# Patient Record
Sex: Male | Born: 1975 | Race: White | Hispanic: No | Marital: Single | Smoking: Former smoker
Health system: Southern US, Community
[De-identification: ages and names within clinical notes are randomized; demographics above are authoritative.]

## PROBLEM LIST (undated history)

## (undated) DIAGNOSIS — F32A Depression, unspecified: Secondary | ICD-10-CM

## (undated) DIAGNOSIS — I10 Essential (primary) hypertension: Secondary | ICD-10-CM

## (undated) DIAGNOSIS — T7840XA Allergy, unspecified, initial encounter: Secondary | ICD-10-CM

## (undated) DIAGNOSIS — J302 Other seasonal allergic rhinitis: Secondary | ICD-10-CM

## (undated) HISTORY — PX: KNEE SURGERY: SHX244

## (undated) HISTORY — DX: Allergy, unspecified, initial encounter: T78.40XA

---

## 2014-04-23 LAB — BASIC METABOLIC PANEL WITH GFR
BUN: 17 mg/dL (ref 4–21)
Creatinine: 1.1 mg/dL (ref <=1.3)
Glucose: 99 mg/dL
Potassium: 4.1 mmol/L (ref 3.4–5.3)
Sodium: 141 mmol/L (ref 137–147)

## 2014-04-23 LAB — HEPATIC FUNCTION PANEL
ALT: 52 U/L — AB (ref 10–40)
AST: 26 U/L (ref 14–40)
Alkaline Phosphatase: 52 U/L (ref 25–125)

## 2014-04-23 LAB — LIPID PANEL
Cholesterol: 219 mg/dL — AB (ref 0–200)
HDL: 38 mg/dL (ref 35–70)
LDL Cholesterol: 124 mg/dL
LDl/HDL Ratio: 3.3
Triglycerides: 283 mg/dL — AB (ref 40–160)

## 2014-04-23 LAB — CBC AND DIFFERENTIAL
HCT: 44 % (ref 41–53)
Hemoglobin: 14.9 g/dL (ref 13.5–17.5)
Neutrophils Absolute: 2 /uL
Platelets: 183 K/µL (ref 150–399)
WBC: 4 10*3/mL

## 2014-04-23 LAB — PSA: PSA: 0.5

## 2014-04-23 LAB — TSH: TSH: 1.31 u[IU]/mL (ref <=5.90)

## 2015-01-14 DIAGNOSIS — J309 Allergic rhinitis, unspecified: Secondary | ICD-10-CM | POA: Insufficient documentation

## 2015-01-14 DIAGNOSIS — F411 Generalized anxiety disorder: Secondary | ICD-10-CM | POA: Insufficient documentation

## 2015-01-14 DIAGNOSIS — G4700 Insomnia, unspecified: Secondary | ICD-10-CM | POA: Insufficient documentation

## 2015-01-14 DIAGNOSIS — D1720 Benign lipomatous neoplasm of skin and subcutaneous tissue of unspecified limb: Secondary | ICD-10-CM | POA: Insufficient documentation

## 2015-01-14 DIAGNOSIS — E669 Obesity, unspecified: Secondary | ICD-10-CM | POA: Insufficient documentation

## 2015-01-15 ENCOUNTER — Encounter: Payer: Self-pay | Admitting: Family Medicine

## 2015-01-15 ENCOUNTER — Ambulatory Visit (INDEPENDENT_AMBULATORY_CARE_PROVIDER_SITE_OTHER): Payer: BLUE CROSS/BLUE SHIELD | Admitting: Family Medicine

## 2015-01-15 VITALS — BP 110/68 | HR 88 | Temp 97.6°F | Resp 16 | Ht >= 80 in | Wt 347.0 lb

## 2015-01-15 DIAGNOSIS — J309 Allergic rhinitis, unspecified: Secondary | ICD-10-CM

## 2015-01-15 DIAGNOSIS — I889 Nonspecific lymphadenitis, unspecified: Secondary | ICD-10-CM

## 2015-01-15 MED ORDER — FLUTICASONE PROPIONATE 50 MCG/ACT NA SUSP: 2.0000 | Freq: Every day | NASAL | Status: DC

## 2015-01-15 NOTE — Progress Notes (Signed)
Subjective:    Patient ID: Andrew Andersen, male    DOB: 1975/10/24, 39 y.o.   MRN: [id number]  HPI Allergic Rhinitis: Izic Stfort is here for evaluation of persistent allergic rhinitis. Patient's symptoms include nasal congestion, phlegm in throat . These symptoms are seasonal. Current triggers include exposure to dust, pollen. The patient has been suffering from these symptoms for approximately 3 months. The patient has tried prescription nasal sprays (Astelin) with minor relief of symptoms. Immunotherapy has never been tried. The patient has never had nasal polyps. The patient has no history of asthma. The patient does not suffer from frequent sinopulmonary infections. The patient has not had sinus surgery in the past. The patient has no history of eczema. Patient reports that he has seen Dr. Richardson Landry at [facility], last ov was about 6 months ago. Patient reports that he been tested once for allergies with scratch test and once with blood test. Patient reports that he has been taking OTC Allergy Relief -D for to help with symptoms. Patient reports that today he is feeling much better. Patient reports that he is not sure if quitting snuff may have improved his symptoms.   Review of Systems  Constitutional: Negative.   HENT: Positive for congestion, postnasal drip, rhinorrhea, sinus pressure, sore throat, tinnitus and trouble swallowing.   Eyes: Negative.   Respiratory: Negative.  Negative for cough, shortness of breath and wheezing.   Endocrine: Negative.   Genitourinary: Negative.   Musculoskeletal: Negative.   Allergic/Immunologic: Positive for environmental allergies.  Neurological: Negative.   Psychiatric/Behavioral: Negative.    Patient Active Problem List   Diagnosis Date Noted  . Allergic rhinitis 01/14/2015  . Anxiety, generalized 01/14/2015  . Cannot sleep 01/14/2015  . 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 Adiposity 01/14/2015   Past Medical History  Diagnosis Date  . Allergy     Current Outpatient Prescriptions on File Prior to Visit  Medication Sig  . azelastine (ASTELIN) 0.1 % nasal spray Place into the nose.   No current facility-administered medications on file prior to visit.   No Known Allergies Past Surgical History  Procedure Laterality Date  . Knee surgery Left    History   Social History  . Marital Status: Single    Spouse Name: N/A  . Number of Children: N/A  . Years of Education: N/A   Occupational History  . Not on file.   Social History Main Topics  . Smoking status: Former Smoker -- 1.00 packs/day for 10 years    Quit date: 07/24/2012  . Smokeless tobacco: Former Systems developer    Types: Snuff    Quit date: 01/13/2015  . Alcohol Use: No  . Drug Use: No  . Sexual Activity: Not on file   Other Topics Concern  . Not on file   Social History Narrative   Family History  Problem Relation Age of Onset  . Hypertension Mother   . Heart disease Maternal Grandmother   . Heart attack Maternal Grandfather   . Dementia Paternal Grandmother   . Alzheimer's disease Paternal Grandmother        Objective:   Physical Exam  Constitutional: He is oriented to person, place, and time. He appears well-developed and well-nourished.  HENT:  Head: Normocephalic and atraumatic.  Right Ear: External ear normal.  Left Ear: External ear normal.  Nose: Nose normal.  Mouth/Throat: Oropharynx is clear and moist.  Eyes: Conjunctivae and EOM are normal. Pupils are equal, round, and reactive to light.  Neck: Normal  range of motion. Neck supple.  Cardiovascular: Normal rate, regular rhythm, normal heart sounds and intact distal pulses.   Pulmonary/Chest: Effort normal and breath sounds normal.  Abdominal: Soft. Bowel sounds are normal.  Lymphadenopathy:       Head (right side): No submandibular and no tonsillar adenopathy present.       Head (left side): No submandibular and no tonsillar adenopathy present.       Right cervical: No superficial cervical,  no deep cervical and no posterior cervical adenopathy present.      Left cervical: No superficial cervical, no deep cervical and no posterior cervical adenopathy present.  Neurological: He is alert and oriented to person, place, and time.  Skin: Skin is warm and dry.  Psychiatric: He has a normal mood and affect. His behavior is normal. Judgment and thought content normal.   Blood pressure 110/68, pulse 88, temperature 97.6 F (36.4 C), temperature source Oral, resp. rate 16, height 6\' 8"  (2.032 m), weight 347 lb (157.398 kg), SpO2 99 %.      Assessment & Plan:   1. Allergic rhinitis, unspecified allergic rhinitis type Worsening. Patient advised to start Fluticasone 50 mcg.  - Ambulatory referral to Allergy--Dr Donneta Romberg.  2. Lymphadenitis No rx necessary.

## 2015-07-01 ENCOUNTER — Encounter: Payer: Self-pay | Admitting: Family Medicine

## 2015-07-01 ENCOUNTER — Ambulatory Visit (INDEPENDENT_AMBULATORY_CARE_PROVIDER_SITE_OTHER): Payer: BLUE CROSS/BLUE SHIELD | Admitting: Family Medicine

## 2015-07-01 VITALS — BP 124/64 | HR 82 | Temp 97.8°F | Resp 16 | Ht >= 80 in | Wt 345.0 lb

## 2015-07-01 DIAGNOSIS — Z0000 Encounter for general adult medical examination without abnormal findings: Secondary | ICD-10-CM

## 2015-07-01 DIAGNOSIS — Z1211 Encounter for screening for malignant neoplasm of colon: Secondary | ICD-10-CM | POA: Diagnosis not present

## 2015-07-01 DIAGNOSIS — Z8042 Family history of malignant neoplasm of prostate: Secondary | ICD-10-CM

## 2015-07-01 DIAGNOSIS — H6692 Otitis media, unspecified, left ear: Secondary | ICD-10-CM

## 2015-07-01 LAB — POCT URINALYSIS DIPSTICK
Bilirubin, UA: NEGATIVE
Blood, UA: NEGATIVE
GLUCOSE UA: NEGATIVE
KETONES UA: NEGATIVE
LEUKOCYTES UA: NEGATIVE
Nitrite, UA: NEGATIVE
PROTEIN UA: NEGATIVE
SPEC GRAV UA: 1.01
Urobilinogen, UA: 0.2
pH, UA: 6.5

## 2015-07-01 LAB — HEMOCCULT GUIAC POC 1CARD (OFFICE): Fecal Occult Blood, POC: NEGATIVE

## 2015-07-01 MED ORDER — AMOXICILLIN-POT CLAVULANATE 875-125 MG PO TABS: 1.0000 | ORAL_TABLET | Freq: Two times a day (BID) | ORAL | Status: DC

## 2015-07-01 NOTE — Progress Notes (Signed)
Patient ID: Andrew Andersen, male   DOB: June 14, 1976, 39 y.o.   MRN: [id number]       Patient: Andrew Andersen, Male    DOB: 04/28/1976, 39 y.o.   MRN: [id number] Visit Date: 07/01/2015  Today's Provider: Wilhemena Durie, MD   Chief Complaint  Patient presents with  . Annual Exam   Subjective:    Annual physical exam [name] is a 39 y.o. male who presents today for health maintenance and complete physical. He feels well. He reports he is not exercising. He reports he is sleeping well.  ----------------------------------------------------------------- EKG- 02/20/14 Tdap- 01/10/07  He reports that his ear has been bothering him. Yesterday it was hurting and today it feels like pressure. Located on his left ear.  Review of Systems  Constitutional: Negative.   HENT: Positive for ear pain.   Eyes: Negative.   Respiratory: Negative.   Cardiovascular: Negative.   Gastrointestinal: Negative.   Endocrine: Negative.   Genitourinary: Negative.   Musculoskeletal: Positive for arthralgias.  Allergic/Immunologic: Negative.   Neurological: Negative.   Hematological: Negative.   Psychiatric/Behavioral: Negative.     Social History      He  reports that he quit smoking about 2 years ago. He quit smokeless tobacco use about 5 months ago. His smokeless tobacco use included Snuff. He reports that he does not drink alcohol or use illicit drugs.       Social History   Social History  . Marital Status: Single    Spouse Name: N/A  . Number of Children: N/A  . Years of Education: N/A   Social History Main Topics  . Smoking status: Former Smoker -- 1.00 packs/day for 10 years    Quit date: 07/24/2012  . Smokeless tobacco: Former Systems developer    Types: Snuff    Quit date: 01/13/2015  . Alcohol Use: No  . Drug Use: No  . Sexual Activity: Not Asked   Other Topics Concern  . None   Social History Narrative    Patient Active Problem List   Diagnosis Date Noted  . Allergic rhinitis  01/14/2015  . Anxiety, generalized 01/14/2015  . Cannot sleep 01/14/2015  . 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 Adiposity 01/14/2015    Past Surgical History  Procedure Laterality Date  . Knee surgery Left     Family History        Family Status  Relation Status Death Age  . Mother Alive   . Father Alive     pre diabetes  . Sister Alive   . Maternal Grandmother Alive   . Maternal Grandfather Deceased   . Paternal Grandmother Deceased   . Paternal Grandfather Deceased         His family history includes Alzheimer's disease in his paternal grandmother; Dementia in his paternal grandmother; Heart attack in his maternal grandfather; Heart disease in his maternal grandmother; Hypertension in his mother.    No Known Allergies  Previous Medications   AZELASTINE (ASTELIN) 0.1 % NASAL SPRAY    Place into the nose.   DYMISTA 137-50 MCG/ACT SUSP       LEVOCETIRIZINE (XYZAL) 5 MG TABLET    Take 5 mg by mouth every evening.   MONTELUKAST (SINGULAIR) 10 MG TABLET    Take 10 mg by mouth every evening.    Patient Care Team: Jerrol Banana., MD as PCP - General (Family Medicine)     Objective:   Vitals: BP 124/64 mmHg  Pulse 82  Temp(Src) 97.8 F (  36.6 C) (Oral)  Resp 16  Ht 6\' 8"  (2.032 m)  Wt 345 lb (156.491 kg)  BMI 37.90 kg/m2   Physical Exam  Constitutional: He is oriented to person, place, and time. He appears well-developed and well-nourished.  HENT:  Head: Normocephalic and atraumatic.  Right Ear: External ear normal.  Left Ear: External ear normal.  Nose: Nose normal.  Mouth/Throat: Oropharynx is clear and moist.  Eyes: Conjunctivae and EOM are normal. Pupils are equal, round, and reactive to light.  Neck: Normal range of motion. Neck supple.  Cardiovascular: Normal rate, regular rhythm, normal heart sounds and intact distal pulses.   Pulmonary/Chest: Effort normal and breath sounds normal.  Abdominal: Soft. Bowel sounds are normal.  Genitourinary: Rectum  normal, prostate normal and penis normal. Guaiac negative stool. No penile tenderness.  Musculoskeletal: Normal range of motion.  Neurological: He is alert and oriented to person, place, and time. He has normal reflexes.  Skin: Skin is warm and dry.  Psychiatric: He has a normal mood and affect. His behavior is normal. Judgment and thought content normal.     Depression Screen No flowsheet data found.    Assessment & Plan:     Routine Health Maintenance and Physical Exam  Exercise Activities and Dietary recommendations Goals    None      Immunization History  Administered Date(s) Administered  . Tdap 01/10/2007    Health Maintenance  Topic Date Due  . HIV Screening  06/09/1992  . INFLUENZA VACCINE  06/30/2016 (Originally 02/23/2015)  . TETANUS/TDAP  01/09/2017   FH of prostate cancer with uncle with disease in 87s.  PSA obtained. Discussed health benefits of physical activity, and encouraged him to engage in regular exercise appropriate for his age and condition.                       Obesity               Dietary portion size in regular exercise discussed at length. --------------------------------------------------------------------

## 2015-07-03 LAB — CBC WITH DIFFERENTIAL/PLATELET
Basophils Absolute: 0 10*3/uL (ref 0.0–0.2)
Basos: 1 %
EOS (ABSOLUTE): 0.1 10*3/uL (ref 0.0–0.4)
EOS: 2 %
HEMATOCRIT: 39.8 % (ref 37.5–51.0)
Hemoglobin: 13.6 g/dL (ref 12.6–17.7)
Immature Grans (Abs): 0 10*3/uL (ref 0.0–0.1)
Immature Granulocytes: 0 %
LYMPHS ABS: 1.4 10*3/uL (ref 0.7–3.1)
Lymphs: 29 %
MCH: 29.8 pg (ref 26.6–33.0)
MCHC: 34.2 g/dL (ref 31.5–35.7)
MCV: 87 fL (ref 79–97)
MONOS ABS: 0.5 10*3/uL (ref 0.1–0.9)
Monocytes: 10 %
NEUTROS ABS: 2.8 10*3/uL (ref 1.4–7.0)
Neutrophils: 58 %
PLATELETS: 166 10*3/uL (ref 150–379)
RBC: 4.56 x10E6/uL (ref 4.14–5.80)
RDW: 13.8 % (ref 12.3–15.4)
WBC: 4.7 10*3/uL (ref 3.4–10.8)

## 2015-07-03 LAB — COMPREHENSIVE METABOLIC PANEL
ALK PHOS: 55 IU/L (ref 39–117)
ALT: 24 IU/L (ref 0–44)
AST: 15 IU/L (ref 0–40)
Albumin/Globulin Ratio: 2.4 (ref 1.1–2.5)
Albumin: 4.6 g/dL (ref 3.5–5.5)
BUN/Creatinine Ratio: 13 (ref 8–19)
BUN: 14 mg/dL (ref 6–20)
Bilirubin Total: 0.8 mg/dL (ref 0.0–1.2)
CO2: 23 mmol/L (ref 18–29)
CREATININE: 1.08 mg/dL (ref 0.76–1.27)
Calcium: 9.5 mg/dL (ref 8.7–10.2)
Chloride: 105 mmol/L (ref 97–106)
GFR calc Af Amer: 100 mL/min/{1.73_m2} (ref >59)
GFR calc non Af Amer: 87 mL/min/{1.73_m2} (ref >59)
GLOBULIN, TOTAL: 1.9 g/dL (ref 1.5–4.5)
GLUCOSE: 90 mg/dL (ref 65–99)
Potassium: 4.4 mmol/L (ref 3.5–5.2)
SODIUM: 144 mmol/L (ref 136–144)
Total Protein: 6.5 g/dL (ref 6.0–8.5)

## 2015-07-03 LAB — TSH: TSH: 1.6 u[IU]/mL (ref 0.450–4.500)

## 2015-07-03 LAB — LIPID PANEL WITH LDL/HDL RATIO
Cholesterol, Total: 198 mg/dL (ref 100–199)
HDL: 40 mg/dL (ref >39)
LDL Calculated: 117 mg/dL — ABNORMAL HIGH (ref 0–99)
LDL/HDL RATIO: 2.9 ratio (ref 0.0–3.6)
Triglycerides: 205 mg/dL — ABNORMAL HIGH (ref 0–149)
VLDL Cholesterol Cal: 41 mg/dL — ABNORMAL HIGH (ref 5–40)

## 2015-07-03 LAB — PSA: PROSTATE SPECIFIC AG, SERUM: 0.5 ng/mL (ref 0.0–4.0)

## 2015-07-30 ENCOUNTER — Telehealth: Payer: Self-pay | Admitting: Family Medicine

## 2015-07-30 DIAGNOSIS — M79671 Pain in right foot: Secondary | ICD-10-CM

## 2015-07-30 NOTE — Telephone Encounter (Signed)
Please review. Thanks!  

## 2015-07-30 NOTE — Telephone Encounter (Signed)
[facility] Podiatry referral.thanks

## 2015-07-30 NOTE — Telephone Encounter (Signed)
Pt walked saying he was in, in Dec. For a physical.  He also talked to you about an injury on his right foot.  Since he seen you it has still been hurting him.  It's not getting any better.  He wants to know is you will refer him to either orthopedics or podiatry or do you need to see him again.  His call back is  [phone]  Thanks,  Con Memos

## 2015-07-30 NOTE — Telephone Encounter (Signed)
Please refer patient to podiatry. Thanks!

## 2015-07-31 ENCOUNTER — Encounter: Payer: Self-pay | Admitting: Sports Medicine

## 2015-07-31 ENCOUNTER — Ambulatory Visit (INDEPENDENT_AMBULATORY_CARE_PROVIDER_SITE_OTHER): Payer: BLUE CROSS/BLUE SHIELD | Admitting: Sports Medicine

## 2015-07-31 ENCOUNTER — Ambulatory Visit (INDEPENDENT_AMBULATORY_CARE_PROVIDER_SITE_OTHER): Payer: BLUE CROSS/BLUE SHIELD

## 2015-07-31 DIAGNOSIS — M1000 Idiopathic gout, unspecified site: Secondary | ICD-10-CM | POA: Diagnosis not present

## 2015-07-31 DIAGNOSIS — M79671 Pain in right foot: Secondary | ICD-10-CM | POA: Diagnosis not present

## 2015-07-31 LAB — CBC WITH DIFFERENTIAL/PLATELET
BASOS ABS: 0 10*3/uL (ref 0.0–0.2)
Basos: 1 %
EOS (ABSOLUTE): 0.1 10*3/uL (ref 0.0–0.4)
Eos: 2 %
HEMATOCRIT: 39.4 % (ref 37.5–51.0)
HEMOGLOBIN: 13.6 g/dL (ref 12.6–17.7)
Immature Grans (Abs): 0 10*3/uL (ref 0.0–0.1)
Immature Granulocytes: 0 %
LYMPHS ABS: 1.4 10*3/uL (ref 0.7–3.1)
Lymphs: 25 %
MCH: 29.2 pg (ref 26.6–33.0)
MCHC: 34.5 g/dL (ref 31.5–35.7)
MCV: 85 fL (ref 79–97)
MONOCYTES: 14 %
MONOS ABS: 0.8 10*3/uL (ref 0.1–0.9)
NEUTROS ABS: 3.1 10*3/uL (ref 1.4–7.0)
Neutrophils: 58 %
Platelets: 173 10*3/uL (ref 150–379)
RBC: 4.66 x10E6/uL (ref 4.14–5.80)
RDW: 13.4 % (ref 12.3–15.4)
WBC: 5.4 10*3/uL (ref 3.4–10.8)

## 2015-07-31 LAB — SEDIMENTATION RATE: Sed Rate: 8 mm/hr (ref 0–15)

## 2015-07-31 MED ORDER — COLCHICINE 0.6 MG PO TABS: 0.6000 mg | ORAL_TABLET | Freq: Two times a day (BID) | ORAL | Status: DC

## 2015-07-31 MED ORDER — TRIAMCINOLONE ACETONIDE 10 MG/ML IJ SUSP: 10.0000 mg | Freq: Once | INTRAMUSCULAR | Status: DC

## 2015-07-31 NOTE — Patient Instructions (Signed)

## 2015-07-31 NOTE — Progress Notes (Deleted)
   Subjective:    Patient ID: Andrew Andersen, male    DOB: [birth year], 39 y.o.   MRN: [id number]  HPI    Review of Systems  All other systems reviewed and are negative.      Objective:   Physical Exam        Assessment & Plan:

## 2015-07-31 NOTE — Progress Notes (Signed)
Patient ID: Andrew Andersen, male   DOB: [birth year], 40 y.o.   MRN: [id number]  Subjective: Andrew Andersen is a 40 y.o. male patient who presents to office for evaluation of Right foot pain. Patient complains of progressive pain especially over the last 5 days. Admits to warmth, redness, and swelling to the area that is unrelieved. Patient states that he kicked the door a month ago with the same foot and the pain was better but 5 days ago the toe and joint started to flare up and feel stiff and throb. Rest and elevation with no relief in symptoms. Is unsure about to previous gouty attack/family history. Patient denies any other pedal complaints.   Review of Systems  All other systems reviewed and are negative.  Patient Active Problem List   Diagnosis Date Noted  . Allergic rhinitis 01/14/2015  . Anxiety, generalized 01/14/2015  . Cannot sleep 01/14/2015  . 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 Adiposity 01/14/2015   Current Outpatient Prescriptions on File Prior to Visit  Medication Sig Dispense Refill  . DYMISTA 137-50 MCG/ACT SUSP   2  . levocetirizine (XYZAL) 5 MG tablet Take 5 mg by mouth every evening.  3  . montelukast (SINGULAIR) 10 MG tablet Take 10 mg by mouth every evening.  3   No current facility-administered medications on file prior to visit.   No Known Allergies   Objective:  General: Alert and oriented x3 in no acute distress  Dermatology: Focal Swelling, warmth, redness present on the Right 1st MTPJ, No open lesions bilateral lower extremities, no webspace macerations, no ecchymosis bilateral, all nails x 10 are well manicured.  Vascular: Dorsalis Pedis and Posterior Tibial pedal pulses 2/4, Capillary Fill Time 3 seconds,(+) pedal hair growth bilateral,Temperature gradient increased over the Right 1st MTPJ.  Neurology: Johney Maine sensation present via light touch bilateral.   Musculoskeletal: There is tenderness with palpation at 1st MTPJ on Right foot,No pain with calf  compression bilateral. All joint range of motion is within normal limits except at the right 1st MTPJ where there is pain and limiation, Strength within normal limits in all groups bilateral.   Gait: Unassisted, Antalgic gait avoiding weight on right foot.   Xrays  Right foot   Impression: Normal osseous mineralization, Joint space narrowing with old spur/chip fracture at 1st MTPJ and TN joint with no dislocation likely old in nature, Calcaneal heel spur with thickening of plantar fascia, Soft tissue swelling focal at 1st MTPJ, No other acute pathology, No foreign body.         Assessment and Plan: Problem List Items Addressed This Visit    None    Visit Diagnoses    Right foot pain    -  Primary    Relevant Medications    triamcinolone acetonide (KENALOG) 10 MG/ML injection 10 mg    colchicine 0.6 MG tablet    Other Relevant Orders    DG Foot 2 Views Right    CBC with Differential (Completed)    Basic Metabolic Panel    Uric Acid    C-reactive protein    Sedimentation Rate (Completed)    Acute idiopathic gout, unspecified site        Right 1st MTPJ    Relevant Medications    triamcinolone acetonide (KENALOG) 10 MG/ML injection 10 mg    colchicine 0.6 MG tablet    Other Relevant Orders    CBC with Differential (Completed)    Basic Metabolic Panel    Uric Acid  C-reactive protein    Sedimentation Rate (Completed)       -Complete examination performed -Xrays reviewed -Discussed treatement options for gouty arthritis with secondary trauma and gout education provided - After oral consent, injected right 1st MTPJ with 1cc lidocaine and marcaine plain mixed with 0.25cc Kenalog-10 and Dexmethasone phosphate without complication; post injection care explained. -Rx Colchicine 0.6mg  -Ordered arthritic/gout lab panel; Will call if labs are concerning.  -Advised patient to call if symptoms are not improved within 1 week -Patient to return in 2 weeks for re-check/further discussion  for long term management of gout or sooner if condition worsens.  Andrew Andersen, DPM

## 2015-08-01 LAB — BASIC METABOLIC PANEL
BUN/Creatinine Ratio: 12 (ref 8–19)
BUN: 14 mg/dL (ref 6–20)
CO2: 21 mmol/L (ref 18–29)
Calcium: 9.6 mg/dL (ref 8.7–10.2)
Chloride: 102 mmol/L (ref 96–106)
Creatinine, Ser: 1.21 mg/dL (ref 0.76–1.27)
GFR, EST AFRICAN AMERICAN: 87 mL/min/{1.73_m2} (ref >59)
GFR, EST NON AFRICAN AMERICAN: 75 mL/min/{1.73_m2} (ref >59)
Glucose: 89 mg/dL (ref 65–99)
POTASSIUM: 4.2 mmol/L (ref 3.5–5.2)
SODIUM: 147 mmol/L — AB (ref 134–144)

## 2015-08-01 LAB — URIC ACID: Uric Acid: 7.6 mg/dL (ref 3.7–8.6)

## 2015-08-01 LAB — C-REACTIVE PROTEIN: CRP: 20.4 mg/L — ABNORMAL HIGH (ref 0.0–4.9)

## 2015-08-14 ENCOUNTER — Ambulatory Visit: Payer: BLUE CROSS/BLUE SHIELD | Admitting: Sports Medicine

## 2015-08-18 ENCOUNTER — Ambulatory Visit (INDEPENDENT_AMBULATORY_CARE_PROVIDER_SITE_OTHER): Payer: BLUE CROSS/BLUE SHIELD | Admitting: Sports Medicine

## 2015-08-18 ENCOUNTER — Encounter: Payer: Self-pay | Admitting: Sports Medicine

## 2015-08-18 DIAGNOSIS — M1000 Idiopathic gout, unspecified site: Secondary | ICD-10-CM

## 2015-08-18 DIAGNOSIS — M79671 Pain in right foot: Secondary | ICD-10-CM

## 2015-08-18 NOTE — Progress Notes (Signed)
Patient ID: Andrew Andersen, male   DOB: 11/22/1975, 40 y.o.   MRN: [id number]  Subjective: Andrew Andersen is a 40 y.o. male patient who returns to office for evaluation of Right foot pain. Patient states that his right big toe joint feels a lot better since injection. Denies any more pain to site. Patient denies any other pedal complaints.   Patient Active Problem List   Diagnosis Date Noted  . Allergic rhinitis 01/14/2015  . Anxiety, generalized 01/14/2015  . Cannot sleep 01/14/2015  . 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 Adiposity 01/14/2015   Current Outpatient Prescriptions on File Prior to Visit  Medication Sig Dispense Refill  . colchicine 0.6 MG tablet Take 1 tablet (0.6 mg total) by mouth 2 (two) times daily. 28 tablet 0  . DYMISTA 137-50 MCG/ACT SUSP   2  . levocetirizine (XYZAL) 5 MG tablet Take 5 mg by mouth every evening.  3  . montelukast (SINGULAIR) 10 MG tablet Take 10 mg by mouth every evening.  3   Current Facility-Administered Medications on File Prior to Visit  Medication Dose Route Frequency Provider Last Rate Last Dose  . triamcinolone acetonide (KENALOG) 10 MG/ML injection 10 mg  10 mg Other Once [employer], DPM       No Known Allergies   Objective:  General: Alert and oriented x3 in no acute distress  Dermatology: No focal swelling, warmth, redness present on the Right 1st MTPJ, No open lesions bilateral lower extremities, no webspace macerations, no ecchymosis bilateral, all nails x 10 are well manicured.  Vascular: Dorsalis Pedis and Posterior Tibial pedal pulses 2/4, Capillary Fill Time 3 seconds,(+) pedal hair growth bilateral,Temperature gradient increased over the Right 1st MTPJ.  Neurology: Johney Maine sensation present via light touch bilateral.   Musculoskeletal: There is no tenderness with palpation at 1st MTPJ on Right foot,No pain with calf compression bilateral. All joint range of motion is within normal limits without pain and limiation, Strength  within normal limits in all groups bilateral.   Assessment and Plan: Problem List Items Addressed This Visit    None    Visit Diagnoses    Right foot pain    -  Primary    Acute idiopathic gout, unspecified site        vs inflammatory capsulitis 1st MTPJ right foot, resolved       -Complete examination performed -Labs reviewed with patient; increased CRP -Discussed treatement options  -Advised patient to cont to monitor for re-occurence if symptoms returns; ice and return to office for re-evaluation -Patient to return as needed or sooner if condition worsens.  Landis Martins, DPM

## 2015-10-27 DIAGNOSIS — J301 Allergic rhinitis due to pollen: Secondary | ICD-10-CM | POA: Diagnosis not present

## 2015-10-27 DIAGNOSIS — J3089 Other allergic rhinitis: Secondary | ICD-10-CM | POA: Diagnosis not present

## 2015-11-03 DIAGNOSIS — J301 Allergic rhinitis due to pollen: Secondary | ICD-10-CM | POA: Diagnosis not present

## 2015-11-04 DIAGNOSIS — J3089 Other allergic rhinitis: Secondary | ICD-10-CM | POA: Diagnosis not present

## 2015-11-10 DIAGNOSIS — J3089 Other allergic rhinitis: Secondary | ICD-10-CM | POA: Diagnosis not present

## 2015-11-10 DIAGNOSIS — J301 Allergic rhinitis due to pollen: Secondary | ICD-10-CM | POA: Diagnosis not present

## 2015-11-26 DIAGNOSIS — J301 Allergic rhinitis due to pollen: Secondary | ICD-10-CM | POA: Diagnosis not present

## 2015-11-26 DIAGNOSIS — J3089 Other allergic rhinitis: Secondary | ICD-10-CM | POA: Diagnosis not present

## 2015-12-08 DIAGNOSIS — J301 Allergic rhinitis due to pollen: Secondary | ICD-10-CM | POA: Diagnosis not present

## 2015-12-08 DIAGNOSIS — J3089 Other allergic rhinitis: Secondary | ICD-10-CM | POA: Diagnosis not present

## 2015-12-17 DIAGNOSIS — J3089 Other allergic rhinitis: Secondary | ICD-10-CM | POA: Diagnosis not present

## 2015-12-17 DIAGNOSIS — J301 Allergic rhinitis due to pollen: Secondary | ICD-10-CM | POA: Diagnosis not present

## 2016-01-01 DIAGNOSIS — J3089 Other allergic rhinitis: Secondary | ICD-10-CM | POA: Diagnosis not present

## 2016-01-01 DIAGNOSIS — J301 Allergic rhinitis due to pollen: Secondary | ICD-10-CM | POA: Diagnosis not present

## 2016-01-07 DIAGNOSIS — J301 Allergic rhinitis due to pollen: Secondary | ICD-10-CM | POA: Diagnosis not present

## 2016-01-07 DIAGNOSIS — J3089 Other allergic rhinitis: Secondary | ICD-10-CM | POA: Diagnosis not present

## 2016-01-21 DIAGNOSIS — J3089 Other allergic rhinitis: Secondary | ICD-10-CM | POA: Diagnosis not present

## 2016-01-21 DIAGNOSIS — J301 Allergic rhinitis due to pollen: Secondary | ICD-10-CM | POA: Diagnosis not present

## 2016-02-02 DIAGNOSIS — J3089 Other allergic rhinitis: Secondary | ICD-10-CM | POA: Diagnosis not present

## 2016-02-02 DIAGNOSIS — J301 Allergic rhinitis due to pollen: Secondary | ICD-10-CM | POA: Diagnosis not present

## 2016-02-05 DIAGNOSIS — J3089 Other allergic rhinitis: Secondary | ICD-10-CM | POA: Diagnosis not present

## 2016-02-05 DIAGNOSIS — J301 Allergic rhinitis due to pollen: Secondary | ICD-10-CM | POA: Diagnosis not present

## 2016-02-11 DIAGNOSIS — J301 Allergic rhinitis due to pollen: Secondary | ICD-10-CM | POA: Diagnosis not present

## 2016-02-11 DIAGNOSIS — J3089 Other allergic rhinitis: Secondary | ICD-10-CM | POA: Diagnosis not present

## 2016-02-19 DIAGNOSIS — J3089 Other allergic rhinitis: Secondary | ICD-10-CM | POA: Diagnosis not present

## 2016-02-19 DIAGNOSIS — J301 Allergic rhinitis due to pollen: Secondary | ICD-10-CM | POA: Diagnosis not present

## 2016-02-23 DIAGNOSIS — J301 Allergic rhinitis due to pollen: Secondary | ICD-10-CM | POA: Diagnosis not present

## 2016-02-23 DIAGNOSIS — J3089 Other allergic rhinitis: Secondary | ICD-10-CM | POA: Diagnosis not present

## 2016-03-01 DIAGNOSIS — J3089 Other allergic rhinitis: Secondary | ICD-10-CM | POA: Diagnosis not present

## 2016-03-01 DIAGNOSIS — J301 Allergic rhinitis due to pollen: Secondary | ICD-10-CM | POA: Diagnosis not present

## 2016-03-08 DIAGNOSIS — J3089 Other allergic rhinitis: Secondary | ICD-10-CM | POA: Diagnosis not present

## 2016-03-08 DIAGNOSIS — J301 Allergic rhinitis due to pollen: Secondary | ICD-10-CM | POA: Diagnosis not present

## 2016-03-15 DIAGNOSIS — J3089 Other allergic rhinitis: Secondary | ICD-10-CM | POA: Diagnosis not present

## 2016-03-15 DIAGNOSIS — J301 Allergic rhinitis due to pollen: Secondary | ICD-10-CM | POA: Diagnosis not present

## 2016-03-22 DIAGNOSIS — J301 Allergic rhinitis due to pollen: Secondary | ICD-10-CM | POA: Diagnosis not present

## 2016-03-22 DIAGNOSIS — J3089 Other allergic rhinitis: Secondary | ICD-10-CM | POA: Diagnosis not present

## 2016-03-31 DIAGNOSIS — J301 Allergic rhinitis due to pollen: Secondary | ICD-10-CM | POA: Diagnosis not present

## 2016-03-31 DIAGNOSIS — J3089 Other allergic rhinitis: Secondary | ICD-10-CM | POA: Diagnosis not present

## 2016-04-11 ENCOUNTER — Ambulatory Visit (INDEPENDENT_AMBULATORY_CARE_PROVIDER_SITE_OTHER): Payer: BLUE CROSS/BLUE SHIELD | Admitting: Family Medicine

## 2016-04-11 VITALS — BP 110/70 | HR 80 | Temp 97.9°F | Resp 16 | Wt 342.0 lb

## 2016-04-11 DIAGNOSIS — J069 Acute upper respiratory infection, unspecified: Secondary | ICD-10-CM

## 2016-04-11 DIAGNOSIS — H6504 Acute serous otitis media, recurrent, right ear: Secondary | ICD-10-CM | POA: Diagnosis not present

## 2016-04-11 NOTE — Progress Notes (Signed)
Andrew Andersen  MRN: [id number] DOB: Dec 28, 1975  Subjective:  HPI  The patient is a 40 year old male who presents for evaluation of possible sinus infection.  The patient state that he started having a scratchy throat and head congestion 1 week ago.  He had been traveling in [place] and state during the time when this was at it's worst.  He presented at his allergist's office on Friday (3 days ago) for his allergy shot and they told him they would not give him the shot with the infection and that he would need to see him primary provider before they would continue with his shots.  Patient would like treatment so he can resume his shots.  Since the lapse in getting his shot he is now having increased problems with his allergies.  Patient Active Problem List   Diagnosis Date Noted  . Allergic rhinitis 01/14/2015  . Anxiety, generalized 01/14/2015  . Cannot sleep 01/14/2015  . 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 Adiposity 01/14/2015    Past Medical History:  Diagnosis Date  . Allergy     Social History   Social History  . Marital status: Single    Spouse name: N/A  . Number of children: N/A  . Years of education: N/A   Occupational History  . Not on file.   Social History Main Topics  . Smoking status: Former Smoker    Packs/day: 1.00    Years: 10.00    Quit date: 07/24/2012  . Smokeless tobacco: Former Systems developer    Types: Snuff    Quit date: 01/13/2015  . Alcohol use No  . Drug use: No  . Sexual activity: Not on file   Other Topics Concern  . Not on file   Social History Narrative  . No narrative on file    Outpatient Encounter Prescriptions as of 04/11/2016  Medication Sig Note  . DYMISTA 137-50 MCG/ACT SUSP  07/01/2015: Received from: External Pharmacy  . levocetirizine (XYZAL) 5 MG tablet Take 5 mg by mouth every evening. 07/01/2015: Received from: External Pharmacy  . montelukast (SINGULAIR) 10 MG tablet Take 10 mg by mouth every evening. 07/01/2015: Received from:  External Pharmacy  . [DISCONTINUED] colchicine 0.6 MG tablet Take 1 tablet (0.6 mg total) by mouth 2 (two) times daily.    Facility-Administered Encounter Medications as of 04/11/2016  Medication  . triamcinolone acetonide (KENALOG) 10 MG/ML injection 10 mg    No Known Allergies  Review of Systems  Constitutional: Positive for malaise/fatigue. Negative for chills and fever.  HENT: Positive for congestion and tinnitus (chronic). Negative for ear discharge, ear pain, hearing loss and nosebleeds. Sore throat: Had previously  not now.   Eyes: Negative for blurred vision, double vision, photophobia, pain, discharge and redness.  Respiratory: Negative for cough, hemoptysis, sputum production, shortness of breath and wheezing.   Cardiovascular: Negative for chest pain, palpitations, orthopnea, claudication, leg swelling and PND.  Gastrointestinal: Negative.   Genitourinary: Negative.   Neurological: Positive for headaches (sinus, behind his eyes). Negative for dizziness and weakness.  Endo/Heme/Allergies: Negative.   Psychiatric/Behavioral: Negative.    Objective:  BP 110/70   Pulse 80   Temp 97.9 F (36.6 C) (Oral)   Resp 16   Wt (!) 342 lb (155.1 kg)   BMI 37.57 kg/m   Physical Exam  Constitutional: He is oriented to person, place, and time and well-developed, well-nourished, and in no distress.  ., Moderately obese white male in no acute distress  HENT:  Head: Normocephalic and atraumatic.  Right Ear: External ear normal.  Left Ear: External ear normal.  Nose: Nose normal.  Upper third of the right mildly erythematous  Eyes: Conjunctivae are normal.  Neck: Neck supple. No thyromegaly present.  Cardiovascular: Normal rate, regular rhythm and normal heart sounds.   Pulmonary/Chest: Effort normal and breath sounds normal.  Abdominal: Soft.  Genitourinary: Rectum normal.  Lymphadenopathy:    He has no cervical adenopathy.  Neurological: He is alert and oriented to person,  place, and time. Gait normal. GCS score is 15.  Skin: Skin is warm.  Psychiatric: Mood, memory, affect and judgment normal.    Assessment and Plan :  URI Mild right otitis media Both of these issues are resolving. No antibiotics needed. Patient will call back if he worsens or does not improve.  Allergic rhinitis Allergy shots per Dr. Donneta Romberg I have done the exam and reviewed the above chart and it is accurate to the best of my knowledge.

## 2016-04-14 DIAGNOSIS — J3089 Other allergic rhinitis: Secondary | ICD-10-CM | POA: Diagnosis not present

## 2016-04-14 DIAGNOSIS — J301 Allergic rhinitis due to pollen: Secondary | ICD-10-CM | POA: Diagnosis not present

## 2016-04-19 DIAGNOSIS — J3089 Other allergic rhinitis: Secondary | ICD-10-CM | POA: Diagnosis not present

## 2016-04-19 DIAGNOSIS — J301 Allergic rhinitis due to pollen: Secondary | ICD-10-CM | POA: Diagnosis not present

## 2016-04-28 DIAGNOSIS — J301 Allergic rhinitis due to pollen: Secondary | ICD-10-CM | POA: Diagnosis not present

## 2016-04-28 DIAGNOSIS — J3089 Other allergic rhinitis: Secondary | ICD-10-CM | POA: Diagnosis not present

## 2016-05-03 DIAGNOSIS — J3089 Other allergic rhinitis: Secondary | ICD-10-CM | POA: Diagnosis not present

## 2016-05-03 DIAGNOSIS — J301 Allergic rhinitis due to pollen: Secondary | ICD-10-CM | POA: Diagnosis not present

## 2016-05-10 DIAGNOSIS — J3089 Other allergic rhinitis: Secondary | ICD-10-CM | POA: Diagnosis not present

## 2016-05-10 DIAGNOSIS — J301 Allergic rhinitis due to pollen: Secondary | ICD-10-CM | POA: Diagnosis not present

## 2016-05-17 DIAGNOSIS — J301 Allergic rhinitis due to pollen: Secondary | ICD-10-CM | POA: Diagnosis not present

## 2016-05-17 DIAGNOSIS — J3089 Other allergic rhinitis: Secondary | ICD-10-CM | POA: Diagnosis not present

## 2016-05-24 DIAGNOSIS — J301 Allergic rhinitis due to pollen: Secondary | ICD-10-CM | POA: Diagnosis not present

## 2016-05-24 DIAGNOSIS — J3089 Other allergic rhinitis: Secondary | ICD-10-CM | POA: Diagnosis not present

## 2016-06-03 DIAGNOSIS — J301 Allergic rhinitis due to pollen: Secondary | ICD-10-CM | POA: Diagnosis not present

## 2016-06-03 DIAGNOSIS — J3089 Other allergic rhinitis: Secondary | ICD-10-CM | POA: Diagnosis not present

## 2016-06-09 DIAGNOSIS — J301 Allergic rhinitis due to pollen: Secondary | ICD-10-CM | POA: Diagnosis not present

## 2016-06-10 DIAGNOSIS — J3089 Other allergic rhinitis: Secondary | ICD-10-CM | POA: Diagnosis not present

## 2016-06-14 DIAGNOSIS — J301 Allergic rhinitis due to pollen: Secondary | ICD-10-CM | POA: Diagnosis not present

## 2016-06-14 DIAGNOSIS — J3089 Other allergic rhinitis: Secondary | ICD-10-CM | POA: Diagnosis not present

## 2016-06-21 DIAGNOSIS — J301 Allergic rhinitis due to pollen: Secondary | ICD-10-CM | POA: Diagnosis not present

## 2016-06-21 DIAGNOSIS — J3089 Other allergic rhinitis: Secondary | ICD-10-CM | POA: Diagnosis not present

## 2016-06-29 DIAGNOSIS — D225 Melanocytic nevi of trunk: Secondary | ICD-10-CM | POA: Diagnosis not present

## 2016-06-30 ENCOUNTER — Encounter: Payer: Self-pay | Admitting: Family Medicine

## 2016-06-30 ENCOUNTER — Ambulatory Visit (INDEPENDENT_AMBULATORY_CARE_PROVIDER_SITE_OTHER): Payer: BLUE CROSS/BLUE SHIELD | Admitting: Family Medicine

## 2016-06-30 VITALS — BP 114/72 | HR 80 | Temp 97.9°F | Resp 14 | Ht >= 80 in | Wt 347.0 lb

## 2016-06-30 DIAGNOSIS — J3089 Other allergic rhinitis: Secondary | ICD-10-CM | POA: Diagnosis not present

## 2016-06-30 DIAGNOSIS — J301 Allergic rhinitis due to pollen: Secondary | ICD-10-CM | POA: Diagnosis not present

## 2016-06-30 DIAGNOSIS — J302 Other seasonal allergic rhinitis: Secondary | ICD-10-CM | POA: Diagnosis not present

## 2016-06-30 DIAGNOSIS — Z125 Encounter for screening for malignant neoplasm of prostate: Secondary | ICD-10-CM | POA: Diagnosis not present

## 2016-06-30 DIAGNOSIS — H1045 Other chronic allergic conjunctivitis: Secondary | ICD-10-CM | POA: Diagnosis not present

## 2016-06-30 DIAGNOSIS — R0683 Snoring: Secondary | ICD-10-CM

## 2016-06-30 DIAGNOSIS — Z6838 Body mass index (BMI) 38.0-38.9, adult: Secondary | ICD-10-CM | POA: Diagnosis not present

## 2016-06-30 DIAGNOSIS — Z0000 Encounter for general adult medical examination without abnormal findings: Secondary | ICD-10-CM

## 2016-06-30 DIAGNOSIS — Z2821 Immunization not carried out because of patient refusal: Secondary | ICD-10-CM | POA: Diagnosis not present

## 2016-06-30 LAB — POCT URINALYSIS DIPSTICK
BILIRUBIN UA: NEGATIVE
Blood, UA: NEGATIVE
GLUCOSE UA: NEGATIVE
KETONES UA: NEGATIVE
LEUKOCYTES UA: NEGATIVE
Nitrite, UA: NEGATIVE
Spec Grav, UA: 1.02
Urobilinogen, UA: 0.2
pH, UA: 6

## 2016-06-30 NOTE — Progress Notes (Signed)
Patient: Andrew Andersen, Male    DOB: Dec 30, 1975, 40 y.o.   MRN: [id number] Visit Date: 06/30/2016  Today's Provider: Wilhemena Durie, MD   Chief Complaint  Patient presents with  . Annual Exam   Subjective:  Andrew Andersen is a 40 y.o. male who presents today for health maintenance and complete physical. He feels well. He reports exercising walking. He reports he is sleeping well. He snores--no known apnea.Overall he feels well. Active--no structured exercise and no dietary plans.  Last Tdap was 01/10/07 Never had colonoscopy Declines flu shot  Review of Systems  Constitutional: Negative.   HENT: Negative.   Eyes: Negative.   Respiratory: Negative.   Cardiovascular: Negative.   Gastrointestinal: Negative.   Endocrine: Negative.   Genitourinary: Negative.   Musculoskeletal: Negative.   Skin: Negative.   Allergic/Immunologic: Negative.   Neurological: Negative.   Hematological: Negative.   Psychiatric/Behavioral: Negative.     Social History   Social History  . Marital status: Single    Spouse name: N/A  . Number of children: N/A  . Years of education: N/A   Occupational History  . Not on file.   Social History Main Topics  . Smoking status: Former Smoker    Packs/day: 1.00    Years: 10.00    Quit date: 07/24/2012  . Smokeless tobacco: Former Systems developer    Types: Snuff    Quit date: 01/13/2015  . Alcohol use No  . Drug use: No  . Sexual activity: Not on file   Other Topics Concern  . Not on file   Social History Narrative  . No narrative on file    Patient Active Problem List   Diagnosis Date Noted  . Allergic rhinitis 01/14/2015  . Anxiety, generalized 01/14/2015  . Cannot sleep 01/14/2015  . 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 Adiposity 01/14/2015    Past Surgical History:  Procedure Laterality Date  . KNEE SURGERY Left     His family history includes Alzheimer's disease in his paternal grandmother; Dementia in his paternal grandmother; Heart  attack in his maternal grandfather; Heart disease in his maternal grandmother; Hypertension in his mother.     Outpatient Encounter Prescriptions as of 06/30/2016  Medication Sig Note  . DYMISTA 137-50 MCG/ACT SUSP  07/01/2015: Received from: External Pharmacy  . levocetirizine (XYZAL) 5 MG tablet Take 5 mg by mouth every evening. 07/01/2015: Received from: External Pharmacy  . montelukast (SINGULAIR) 10 MG tablet Take 10 mg by mouth every evening. 07/01/2015: Received from: External Pharmacy  . [DISCONTINUED] triamcinolone acetonide (KENALOG) 10 MG/ML injection 10 mg     No facility-administered encounter medications on file as of 06/30/2016.     Patient Care Team: Jerrol Banana., MD as PCP - General (Family Medicine)      Objective:   Vitals:  Vitals:   06/30/16 0910  BP: 114/72  Pulse: 80  Resp: 14  Temp: 97.9 F (36.6 C)  Weight: (!) 347 lb (157.4 kg)  Height: 6\' 8"  (2.032 m)    Physical Exam  Constitutional: He is oriented to person, place, and time. He appears well-developed and well-nourished.  HENT:  Head: Normocephalic and atraumatic.  Right Ear: External ear normal.  Left Ear: External ear normal.  Mouth/Throat: Oropharynx is clear and moist.  Eyes: Conjunctivae are normal. Pupils are equal, round, and reactive to light.  Neck: Normal range of motion. Neck supple.  Cardiovascular: Normal rate, regular rhythm, normal heart sounds and intact distal pulses.  No murmur heard. Pulmonary/Chest: Effort normal and breath sounds normal. No respiratory distress. He has no wheezes.  Abdominal: Soft. He exhibits no distension. There is no tenderness.  Genitourinary: Rectum normal, prostate normal and penis normal.  Musculoskeletal: He exhibits no edema or tenderness.  Neurological: He is alert and oriented to person, place, and time.  Skin: Skin is warm and dry. No rash noted. No erythema.  Psychiatric: He has a normal mood and affect. His behavior is normal. Judgment  and thought content normal.     Depression Screen PHQ 2/9 Scores 07/01/2015  PHQ - 2 Score 0    Assessment & Plan:   1. Annual physical exam - CBC w/Diff/Platelet - Comprehensive metabolic panel - Lipid Panel With LDL/HDL Ratio - TSH - POCT urinalysis dipstick  2. Acute seasonal allergic rhinitis due to other allergen  3. BMI 38.0-38.9,adult/Obesity - TSH Dietary changes discussed and goal of waist of 36 or 38 inches is confirmed--pt has a very large frame. He Is 6 foot 8 inches tall. 4. Influenza vaccination declined by patient 5. Snoring Epworth score is 0. Despite Epworth pt may have OSA. 6. Prostate cancer screening - PSA 7.Atypical Nevi Seen by dermatology yesterday.  HPI, Exam and A&P transcribed under direction and in the presence of Miguel Aschoff, MD. I have done the exam and reviewed the chart and it is accurate to the best of my knowledge. Development worker, community has been used and  any errors in dictation or transcription are unintentional. Miguel Aschoff M.D. [facility]

## 2016-07-01 LAB — COMPREHENSIVE METABOLIC PANEL
A/G RATIO: 2.1 (ref 1.2–2.2)
ALK PHOS: 47 IU/L (ref 39–117)
ALT: 41 IU/L (ref 0–44)
AST: 22 IU/L (ref 0–40)
Albumin: 4.5 g/dL (ref 3.5–5.5)
BUN/Creatinine Ratio: 10 (ref 9–20)
BUN: 11 mg/dL (ref 6–20)
Bilirubin Total: 0.5 mg/dL (ref 0.0–1.2)
CO2: 26 mmol/L (ref 18–29)
Calcium: 9.2 mg/dL (ref 8.7–10.2)
Chloride: 104 mmol/L (ref 96–106)
Creatinine, Ser: 1.11 mg/dL (ref 0.76–1.27)
GFR calc Af Amer: 96 mL/min/{1.73_m2} (ref >59)
GFR calc non Af Amer: 83 mL/min/{1.73_m2} (ref >59)
GLOBULIN, TOTAL: 2.1 g/dL (ref 1.5–4.5)
Glucose: 86 mg/dL (ref 65–99)
POTASSIUM: 4 mmol/L (ref 3.5–5.2)
SODIUM: 142 mmol/L (ref 134–144)
Total Protein: 6.6 g/dL (ref 6.0–8.5)

## 2016-07-01 LAB — CBC WITH DIFFERENTIAL/PLATELET
BASOS: 1 %
Basophils Absolute: 0 10*3/uL (ref 0.0–0.2)
EOS (ABSOLUTE): 0.1 10*3/uL (ref 0.0–0.4)
EOS: 2 %
HEMATOCRIT: 43 % (ref 37.5–51.0)
Hemoglobin: 14.3 g/dL (ref 13.0–17.7)
Immature Grans (Abs): 0 10*3/uL (ref 0.0–0.1)
Immature Granulocytes: 0 %
LYMPHS ABS: 1.1 10*3/uL (ref 0.7–3.1)
Lymphs: 29 %
MCH: 29.5 pg (ref 26.6–33.0)
MCHC: 33.3 g/dL (ref 31.5–35.7)
MCV: 89 fL (ref 79–97)
MONOS ABS: 0.3 10*3/uL (ref 0.1–0.9)
Monocytes: 8 %
Neutrophils Absolute: 2.3 10*3/uL (ref 1.4–7.0)
Neutrophils: 60 %
Platelets: 174 10*3/uL (ref 150–379)
RBC: 4.85 x10E6/uL (ref 4.14–5.80)
RDW: 14.2 % (ref 12.3–15.4)
WBC: 3.9 10*3/uL (ref 3.4–10.8)

## 2016-07-01 LAB — LIPID PANEL WITH LDL/HDL RATIO
CHOLESTEROL TOTAL: 214 mg/dL — AB (ref 100–199)
HDL: 40 mg/dL (ref >39)
LDL CALC: 119 mg/dL — AB (ref 0–99)
LDl/HDL Ratio: 3 ratio units (ref 0.0–3.6)
TRIGLYCERIDES: 274 mg/dL — AB (ref 0–149)
VLDL Cholesterol Cal: 55 mg/dL — ABNORMAL HIGH (ref 5–40)

## 2016-07-01 LAB — PSA: PROSTATE SPECIFIC AG, SERUM: 0.6 ng/mL (ref 0.0–4.0)

## 2016-07-01 LAB — TSH: TSH: 1.72 u[IU]/mL (ref 0.450–4.500)

## 2016-07-12 DIAGNOSIS — J3089 Other allergic rhinitis: Secondary | ICD-10-CM | POA: Diagnosis not present

## 2016-07-12 DIAGNOSIS — J301 Allergic rhinitis due to pollen: Secondary | ICD-10-CM | POA: Diagnosis not present

## 2016-07-28 DIAGNOSIS — J301 Allergic rhinitis due to pollen: Secondary | ICD-10-CM | POA: Diagnosis not present

## 2016-07-28 DIAGNOSIS — J3089 Other allergic rhinitis: Secondary | ICD-10-CM | POA: Diagnosis not present

## 2016-08-02 DIAGNOSIS — J3089 Other allergic rhinitis: Secondary | ICD-10-CM | POA: Diagnosis not present

## 2016-08-02 DIAGNOSIS — J301 Allergic rhinitis due to pollen: Secondary | ICD-10-CM | POA: Diagnosis not present

## 2016-08-04 DIAGNOSIS — J301 Allergic rhinitis due to pollen: Secondary | ICD-10-CM | POA: Diagnosis not present

## 2016-08-04 DIAGNOSIS — J3089 Other allergic rhinitis: Secondary | ICD-10-CM | POA: Diagnosis not present

## 2016-08-09 DIAGNOSIS — J3089 Other allergic rhinitis: Secondary | ICD-10-CM | POA: Diagnosis not present

## 2016-08-09 DIAGNOSIS — J301 Allergic rhinitis due to pollen: Secondary | ICD-10-CM | POA: Diagnosis not present

## 2016-08-12 DIAGNOSIS — J301 Allergic rhinitis due to pollen: Secondary | ICD-10-CM | POA: Diagnosis not present

## 2016-08-12 DIAGNOSIS — J3089 Other allergic rhinitis: Secondary | ICD-10-CM | POA: Diagnosis not present

## 2016-08-16 DIAGNOSIS — J301 Allergic rhinitis due to pollen: Secondary | ICD-10-CM | POA: Diagnosis not present

## 2016-08-16 DIAGNOSIS — J3089 Other allergic rhinitis: Secondary | ICD-10-CM | POA: Diagnosis not present

## 2016-08-25 DIAGNOSIS — J301 Allergic rhinitis due to pollen: Secondary | ICD-10-CM | POA: Diagnosis not present

## 2016-08-25 DIAGNOSIS — J3089 Other allergic rhinitis: Secondary | ICD-10-CM | POA: Diagnosis not present

## 2016-08-30 DIAGNOSIS — J3089 Other allergic rhinitis: Secondary | ICD-10-CM | POA: Diagnosis not present

## 2016-08-30 DIAGNOSIS — J301 Allergic rhinitis due to pollen: Secondary | ICD-10-CM | POA: Diagnosis not present

## 2016-09-06 DIAGNOSIS — J301 Allergic rhinitis due to pollen: Secondary | ICD-10-CM | POA: Diagnosis not present

## 2016-09-06 DIAGNOSIS — J3089 Other allergic rhinitis: Secondary | ICD-10-CM | POA: Diagnosis not present

## 2016-09-13 DIAGNOSIS — J3089 Other allergic rhinitis: Secondary | ICD-10-CM | POA: Diagnosis not present

## 2016-09-13 DIAGNOSIS — J301 Allergic rhinitis due to pollen: Secondary | ICD-10-CM | POA: Diagnosis not present

## 2016-09-27 DIAGNOSIS — J3089 Other allergic rhinitis: Secondary | ICD-10-CM | POA: Diagnosis not present

## 2016-09-27 DIAGNOSIS — J301 Allergic rhinitis due to pollen: Secondary | ICD-10-CM | POA: Diagnosis not present

## 2016-10-11 DIAGNOSIS — J301 Allergic rhinitis due to pollen: Secondary | ICD-10-CM | POA: Diagnosis not present

## 2016-10-11 DIAGNOSIS — J3089 Other allergic rhinitis: Secondary | ICD-10-CM | POA: Diagnosis not present

## 2016-10-18 DIAGNOSIS — J301 Allergic rhinitis due to pollen: Secondary | ICD-10-CM | POA: Diagnosis not present

## 2016-10-18 DIAGNOSIS — J3089 Other allergic rhinitis: Secondary | ICD-10-CM | POA: Diagnosis not present

## 2016-10-27 DIAGNOSIS — J301 Allergic rhinitis due to pollen: Secondary | ICD-10-CM | POA: Diagnosis not present

## 2016-10-27 DIAGNOSIS — J3089 Other allergic rhinitis: Secondary | ICD-10-CM | POA: Diagnosis not present

## 2016-11-01 DIAGNOSIS — J301 Allergic rhinitis due to pollen: Secondary | ICD-10-CM | POA: Diagnosis not present

## 2016-11-01 DIAGNOSIS — J3089 Other allergic rhinitis: Secondary | ICD-10-CM | POA: Diagnosis not present

## 2016-11-10 DIAGNOSIS — J301 Allergic rhinitis due to pollen: Secondary | ICD-10-CM | POA: Diagnosis not present

## 2016-11-10 DIAGNOSIS — J3089 Other allergic rhinitis: Secondary | ICD-10-CM | POA: Diagnosis not present

## 2016-11-15 DIAGNOSIS — J301 Allergic rhinitis due to pollen: Secondary | ICD-10-CM | POA: Diagnosis not present

## 2016-11-15 DIAGNOSIS — J3089 Other allergic rhinitis: Secondary | ICD-10-CM | POA: Diagnosis not present

## 2016-11-22 DIAGNOSIS — J301 Allergic rhinitis due to pollen: Secondary | ICD-10-CM | POA: Diagnosis not present

## 2016-11-22 DIAGNOSIS — J3089 Other allergic rhinitis: Secondary | ICD-10-CM | POA: Diagnosis not present

## 2016-11-29 DIAGNOSIS — J301 Allergic rhinitis due to pollen: Secondary | ICD-10-CM | POA: Diagnosis not present

## 2016-11-29 DIAGNOSIS — J3089 Other allergic rhinitis: Secondary | ICD-10-CM | POA: Diagnosis not present

## 2016-12-06 DIAGNOSIS — J3089 Other allergic rhinitis: Secondary | ICD-10-CM | POA: Diagnosis not present

## 2016-12-06 DIAGNOSIS — J301 Allergic rhinitis due to pollen: Secondary | ICD-10-CM | POA: Diagnosis not present

## 2016-12-13 ENCOUNTER — Ambulatory Visit
Admission: RE | Admit: 2016-12-13 | Discharge: 2016-12-13 | Disposition: A | Discharge status: Home / Self Care | Payer: BLUE CROSS/BLUE SHIELD | Source: Ambulatory Visit | Attending: Family Medicine | Admitting: Family Medicine

## 2016-12-13 ENCOUNTER — Ambulatory Visit (INDEPENDENT_AMBULATORY_CARE_PROVIDER_SITE_OTHER): Payer: BLUE CROSS/BLUE SHIELD | Admitting: Family Medicine

## 2016-12-13 ENCOUNTER — Encounter: Payer: Self-pay | Admitting: Family Medicine

## 2016-12-13 VITALS — BP 110/80 | HR 70 | Temp 97.4°F | Resp 16 | Wt 347.0 lb

## 2016-12-13 DIAGNOSIS — M25571 Pain in right ankle and joints of right foot: Secondary | ICD-10-CM

## 2016-12-13 DIAGNOSIS — J3089 Other allergic rhinitis: Secondary | ICD-10-CM | POA: Diagnosis not present

## 2016-12-13 DIAGNOSIS — Z8739 Personal history of other diseases of the musculoskeletal system and connective tissue: Secondary | ICD-10-CM | POA: Diagnosis not present

## 2016-12-13 DIAGNOSIS — J301 Allergic rhinitis due to pollen: Secondary | ICD-10-CM | POA: Diagnosis not present

## 2016-12-13 MED ORDER — NAPROXEN 500 MG PO TABS: 500.0000 mg | ORAL_TABLET | Freq: Two times a day (BID) | ORAL | 1 refills | Status: DC

## 2016-12-13 NOTE — Progress Notes (Signed)
Subjective:  HPI Pt is here for right ankle pain. He woke up about 4-5 days ago with ankle pain and noticed it hurt to walk on. He says he did not injury it in any way that he knows. He active so he could have. Ankle is swollen and looks red. He has had gout in the past but it was in his toe. He has decreased ROM.   Prior to Admission medications   Medication Sig Start Date End Date Taking? Authorizing Provider  Harrison 137-50 MCG/ACT SUSP  06/13/15   [provider]  levocetirizine (XYZAL) 5 MG tablet Take 5 mg by mouth every evening. 06/10/15   [provider]  montelukast (SINGULAIR) 10 MG tablet Take 10 mg by mouth every evening. 06/10/15   [provider]    Patient Active Problem List   Diagnosis Date Noted  . Allergic rhinitis 01/14/2015  . Anxiety, generalized 01/14/2015  . Cannot sleep 01/14/2015  . 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 Adiposity 01/14/2015    Past Medical History:  Diagnosis Date  . Allergy     Social History   Social History  . Marital status: Single    Spouse name: N/A  . Number of children: N/A  . Years of education: N/A   Occupational History  . Not on file.   Social History Main Topics  . Smoking status: Former Smoker    Packs/day: 1.00    Years: 10.00    Quit date: 07/24/2012  . Smokeless tobacco: Former Systems developer    Types: Snuff    Quit date: 01/13/2015  . Alcohol use No  . Drug use: No  . Sexual activity: Not on file   Other Topics Concern  . Not on file   Social History Narrative  . No narrative on file    No Known Allergies  Review of Systems  Constitutional: Negative.   HENT: Negative.   Eyes: Negative.   Respiratory: Negative.   Cardiovascular: Negative.   Gastrointestinal: Negative.   Genitourinary: Negative.   Musculoskeletal: Positive for joint pain.  Skin: Negative.   Neurological: Negative.   Endo/Heme/Allergies: Negative.   Psychiatric/Behavioral: Negative.     Immunization History    Administered Date(s) Administered  . Tdap 01/10/2007    Objective:  BP 110/80 (BP Location: Left Arm, Patient Position: Sitting, Cuff Size: Large)   Pulse 70   Temp 97.4 F (36.3 C) (Oral)   Resp 16   Wt (!) 347 lb (157.4 kg)   BMI 38.12 kg/m   Physical Exam  Constitutional: He is oriented to person, place, and time and well-developed, well-nourished, and in no distress.  HENT:  Head: Normocephalic and atraumatic.  Right Ear: External ear normal.  Left Ear: External ear normal.  Nose: Nose normal.  Cardiovascular: Normal rate, regular rhythm, normal heart sounds and intact distal pulses.   Pulmonary/Chest: Effort normal and breath sounds normal.  Abdominal: Soft.  Musculoskeletal: He exhibits tenderness.  Mild right foot tenderness.  Neurological: He is alert and oriented to person, place, and time. He has normal reflexes. Gait normal. GCS score is 15.  Skin: Skin is warm and dry.  Psychiatric: Mood, memory, affect and judgment normal.    Lab Results  Component Value Date   WBC 3.9 06/30/2016   HGB 14.9 04/23/2014   HCT 43.0 06/30/2016   PLT 174 06/30/2016   GLUCOSE 86 06/30/2016   CHOL 214 (H) 06/30/2016   TRIG 274 (H) 06/30/2016   HDL 40 06/30/2016  LDLCALC 119 (H) 06/30/2016   TSH 1.720 06/30/2016   PSA 0.5 04/23/2014    CMP     Component Value Date/Time   NA 142 06/30/2016 1019   K 4.0 06/30/2016 1019   CL 104 06/30/2016 1019   CO2 26 06/30/2016 1019   GLUCOSE 86 06/30/2016 1019   BUN 11 06/30/2016 1019   CREATININE 1.11 06/30/2016 1019   CALCIUM 9.2 06/30/2016 1019   PROT 6.6 06/30/2016 1019   ALBUMIN 4.5 06/30/2016 1019   AST 22 06/30/2016 1019   ALT 41 06/30/2016 1019   ALKPHOS 47 06/30/2016 1019   BILITOT 0.5 06/30/2016 1019   GFRNONAA 83 06/30/2016 1019   GFRAA 96 06/30/2016 1019    Assessment and Plan :  1. Acute right ankle pain Likely ankle sprain/less likely gout. - naproxen (NAPROSYN) 500 MG tablet; Take 1 tablet (500 mg total) by  mouth 2 (two) times daily with a meal.  Dispense: 60 tablet; Refill: 1 - DG Ankle 2 Views Right; Future - CBC with Differential/Platelet - Uric acid  2. History of gout  - CBC with Differential/Platelet - Uric acid  HPI, Exam, and A&P Transcribed under the direction and in the presence of Adante Courington L. Cranford Mon, MD  Electronically Signed: Katina Dung, CMA I have done the exam and reviewed the above chart and it is accurate to the best of my knowledge. Development worker, community has been used in this note in any air is in the dictation or transcription are unintentional.  [facility] 12/13/2016 10:14 AM

## 2016-12-13 NOTE — Patient Instructions (Addendum)
If this gets worse, can refer to specialist. Call if you do not get better or things get worse.     Gout Gout is painful swelling that can happen in some of your joints. Gout is a type of arthritis. This condition is caused by having too much uric acid in your body. Uric acid is a chemical that is made when your body breaks down substances called purines. If your body has too much uric acid, sharp crystals can form and build up in your joints. This causes pain and swelling. Gout attacks can happen quickly and be very painful (acute gout). Over time, the attacks can affect more joints and happen more often (chronic gout). Follow these instructions at home: During a Gout Attack   If directed, put ice on the painful area:  Put ice in a plastic bag.  Place a towel between your skin and the bag.  Leave the ice on for 20 minutes, 2-3 times a day.  Rest the joint as much as possible. If the joint is in your leg, you may be given crutches to use.  Raise (elevate) the painful joint above the level of your heart as often as you can.  Drink enough fluids to keep your pee (urine) clear or pale yellow.  Take over-the-counter and prescription medicines only as told by your doctor.  Do not drive or use heavy machinery while taking prescription pain medicine.  Follow instructions from your doctor about what you can or cannot eat and drink.  Return to your normal activities as told by your doctor. Ask your doctor what activities are safe for you. Avoiding Future Gout Attacks   Follow a low-purine diet as told by a specialist (dietitian) or your doctor. Avoid foods and drinks that have a lot of purines, such as:  Liver.  Kidney.  Anchovies.  Asparagus.  Herring.  Mushrooms  Mussels.  Beer.  Limit alcohol intake to no more than 1 drink a day for nonpregnant women and 2 drinks a day for men. One drink equals 12 oz of beer, 5 oz of wine, or 1 oz of hard liquor.  Stay at a healthy  weight or lose weight if you are overweight. If you want to lose weight, talk with your doctor. It is important that you do not lose weight too fast.  Start or continue an exercise plan as told by your doctor.  Drink enough fluids to keep your pee clear or pale yellow.  Take over-the-counter and prescription medicines only as told by your doctor.  Keep all follow-up visits as told by your doctor. This is important. Contact a doctor if:  You have another gout attack.  You still have symptoms of a gout attack after10 days of treatment.  You have problems (side effects) because of your medicines.  You have chills or a fever.  You have burning pain when you pee (urinate).  You have pain in your lower back or belly. Get help right away if:  You have very bad pain.  Your pain cannot be controlled.  You cannot pee. This information is not intended to replace advice given to you by your health care provider. Make sure you discuss any questions you have with your health care provider. Document Released: 04/19/2008 Document Revised: 12/17/2015 Document Reviewed: 04/23/2015 Elsevier Interactive Patient Education  2017 [employer].

## 2016-12-14 LAB — CBC WITH DIFFERENTIAL/PLATELET
Basophils Absolute: 0 10*3/uL (ref 0.0–0.2)
Basos: 1 %
EOS (ABSOLUTE): 0.1 10*3/uL (ref 0.0–0.4)
EOS: 2 %
HEMATOCRIT: 40.1 % (ref 37.5–51.0)
Hemoglobin: 13.7 g/dL (ref 13.0–17.7)
Immature Grans (Abs): 0 10*3/uL (ref 0.0–0.1)
Immature Granulocytes: 0 %
Lymphocytes Absolute: 1.1 10*3/uL (ref 0.7–3.1)
Lymphs: 24 %
MCH: 29.3 pg (ref 26.6–33.0)
MCHC: 34.2 g/dL (ref 31.5–35.7)
MCV: 86 fL (ref 79–97)
MONOCYTES: 9 %
MONOS ABS: 0.4 10*3/uL (ref 0.1–0.9)
NEUTROS PCT: 64 %
Neutrophils Absolute: 3 10*3/uL (ref 1.4–7.0)
Platelets: 182 10*3/uL (ref 150–379)
RBC: 4.67 x10E6/uL (ref 4.14–5.80)
RDW: 14.1 % (ref 12.3–15.4)
WBC: 4.7 10*3/uL (ref 3.4–10.8)

## 2016-12-14 LAB — URIC ACID: URIC ACID: 6.9 mg/dL (ref 3.7–8.6)

## 2016-12-15 DIAGNOSIS — J301 Allergic rhinitis due to pollen: Secondary | ICD-10-CM | POA: Diagnosis not present

## 2016-12-16 DIAGNOSIS — J3089 Other allergic rhinitis: Secondary | ICD-10-CM | POA: Diagnosis not present

## 2016-12-22 DIAGNOSIS — J301 Allergic rhinitis due to pollen: Secondary | ICD-10-CM | POA: Diagnosis not present

## 2016-12-22 DIAGNOSIS — J3089 Other allergic rhinitis: Secondary | ICD-10-CM | POA: Diagnosis not present

## 2016-12-23 ENCOUNTER — Encounter: Payer: Self-pay | Admitting: Podiatry

## 2016-12-23 ENCOUNTER — Ambulatory Visit: Payer: BLUE CROSS/BLUE SHIELD

## 2016-12-23 ENCOUNTER — Ambulatory Visit (INDEPENDENT_AMBULATORY_CARE_PROVIDER_SITE_OTHER): Payer: BLUE CROSS/BLUE SHIELD | Admitting: Podiatry

## 2016-12-23 DIAGNOSIS — M659 Synovitis and tenosynovitis, unspecified: Secondary | ICD-10-CM

## 2016-12-23 DIAGNOSIS — M79671 Pain in right foot: Secondary | ICD-10-CM | POA: Diagnosis not present

## 2016-12-24 NOTE — Progress Notes (Signed)
   Subjective:  Patient presents today for dull pain and tightness to the right foot and ankle that began 2 weeks ago. He states he hyperextended the foot while laying in bed. He reports associated swelling that has improved. He denies any pain at this time. Resting his foot and taking naproxen helps to alleviate the pain.    Objective / Physical Exam:  General:  The patient is alert and oriented x3 in no acute distress. Dermatology:  Skin is warm, dry and supple bilateral lower extremities. Negative for open lesions or macerations. Vascular:  Palpable pedal pulses bilaterally. No edema or erythema noted. Capillary refill within normal limits. Neurological:  Epicritic and protective threshold grossly intact bilaterally.  Musculoskeletal Exam:  Mild pain on palpation to the anterior lateral medial aspects of the patient's right ankle. Mild edema noted. Range of motion within normal limits to all pedal and ankle joints bilateral. Muscle strength 5/5 in all groups bilateral.    Assessment: #1 pain in right ankle-improving   Plan of Care:  #1 Patient was evaluated. X-rays in Epic reviewed. #2 continue taking naproxen from PCP. #3 Return to clinic when necessary.  Edrick Kins, DPM [facility]  Dr. Edrick Kins, [address]                                        [place], [place] [address]                Office [phone]  Fax [phone]

## 2016-12-30 DIAGNOSIS — J3089 Other allergic rhinitis: Secondary | ICD-10-CM | POA: Diagnosis not present

## 2016-12-30 DIAGNOSIS — J301 Allergic rhinitis due to pollen: Secondary | ICD-10-CM | POA: Diagnosis not present

## 2017-01-10 DIAGNOSIS — J301 Allergic rhinitis due to pollen: Secondary | ICD-10-CM | POA: Diagnosis not present

## 2017-01-10 DIAGNOSIS — J3089 Other allergic rhinitis: Secondary | ICD-10-CM | POA: Diagnosis not present

## 2017-01-13 DIAGNOSIS — J301 Allergic rhinitis due to pollen: Secondary | ICD-10-CM | POA: Diagnosis not present

## 2017-01-13 DIAGNOSIS — J3089 Other allergic rhinitis: Secondary | ICD-10-CM | POA: Diagnosis not present

## 2017-01-17 DIAGNOSIS — J3089 Other allergic rhinitis: Secondary | ICD-10-CM | POA: Diagnosis not present

## 2017-01-17 DIAGNOSIS — J301 Allergic rhinitis due to pollen: Secondary | ICD-10-CM | POA: Diagnosis not present

## 2017-01-19 DIAGNOSIS — J3089 Other allergic rhinitis: Secondary | ICD-10-CM | POA: Diagnosis not present

## 2017-01-19 DIAGNOSIS — J301 Allergic rhinitis due to pollen: Secondary | ICD-10-CM | POA: Diagnosis not present

## 2017-01-27 DIAGNOSIS — J301 Allergic rhinitis due to pollen: Secondary | ICD-10-CM | POA: Diagnosis not present

## 2017-01-27 DIAGNOSIS — J3089 Other allergic rhinitis: Secondary | ICD-10-CM | POA: Diagnosis not present

## 2017-01-31 DIAGNOSIS — J301 Allergic rhinitis due to pollen: Secondary | ICD-10-CM | POA: Diagnosis not present

## 2017-01-31 DIAGNOSIS — J3089 Other allergic rhinitis: Secondary | ICD-10-CM | POA: Diagnosis not present

## 2017-02-07 DIAGNOSIS — J3089 Other allergic rhinitis: Secondary | ICD-10-CM | POA: Diagnosis not present

## 2017-02-07 DIAGNOSIS — J301 Allergic rhinitis due to pollen: Secondary | ICD-10-CM | POA: Diagnosis not present

## 2017-02-08 DIAGNOSIS — L821 Other seborrheic keratosis: Secondary | ICD-10-CM | POA: Diagnosis not present

## 2017-02-08 DIAGNOSIS — L728 Other follicular cysts of the skin and subcutaneous tissue: Secondary | ICD-10-CM | POA: Diagnosis not present

## 2017-02-14 DIAGNOSIS — J301 Allergic rhinitis due to pollen: Secondary | ICD-10-CM | POA: Diagnosis not present

## 2017-02-14 DIAGNOSIS — J3089 Other allergic rhinitis: Secondary | ICD-10-CM | POA: Diagnosis not present

## 2017-02-21 DIAGNOSIS — J301 Allergic rhinitis due to pollen: Secondary | ICD-10-CM | POA: Diagnosis not present

## 2017-02-21 DIAGNOSIS — J3089 Other allergic rhinitis: Secondary | ICD-10-CM | POA: Diagnosis not present

## 2017-02-28 DIAGNOSIS — J3089 Other allergic rhinitis: Secondary | ICD-10-CM | POA: Diagnosis not present

## 2017-02-28 DIAGNOSIS — J301 Allergic rhinitis due to pollen: Secondary | ICD-10-CM | POA: Diagnosis not present

## 2017-03-01 ENCOUNTER — Other Ambulatory Visit: Payer: Self-pay | Admitting: Family Medicine

## 2017-03-01 DIAGNOSIS — M25571 Pain in right ankle and joints of right foot: Secondary | ICD-10-CM

## 2017-03-01 NOTE — Telephone Encounter (Signed)
Please review, for ankle pain-aa

## 2017-03-07 DIAGNOSIS — J301 Allergic rhinitis due to pollen: Secondary | ICD-10-CM | POA: Diagnosis not present

## 2017-03-07 DIAGNOSIS — J3089 Other allergic rhinitis: Secondary | ICD-10-CM | POA: Diagnosis not present

## 2017-03-14 DIAGNOSIS — J3089 Other allergic rhinitis: Secondary | ICD-10-CM | POA: Diagnosis not present

## 2017-03-14 DIAGNOSIS — J301 Allergic rhinitis due to pollen: Secondary | ICD-10-CM | POA: Diagnosis not present

## 2017-03-23 DIAGNOSIS — J3089 Other allergic rhinitis: Secondary | ICD-10-CM | POA: Diagnosis not present

## 2017-03-23 DIAGNOSIS — J301 Allergic rhinitis due to pollen: Secondary | ICD-10-CM | POA: Diagnosis not present

## 2017-03-28 DIAGNOSIS — J3089 Other allergic rhinitis: Secondary | ICD-10-CM | POA: Diagnosis not present

## 2017-03-28 DIAGNOSIS — J301 Allergic rhinitis due to pollen: Secondary | ICD-10-CM | POA: Diagnosis not present

## 2017-04-06 DIAGNOSIS — J301 Allergic rhinitis due to pollen: Secondary | ICD-10-CM | POA: Diagnosis not present

## 2017-04-06 DIAGNOSIS — J3089 Other allergic rhinitis: Secondary | ICD-10-CM | POA: Diagnosis not present

## 2017-04-13 DIAGNOSIS — J301 Allergic rhinitis due to pollen: Secondary | ICD-10-CM | POA: Diagnosis not present

## 2017-04-13 DIAGNOSIS — J3089 Other allergic rhinitis: Secondary | ICD-10-CM | POA: Diagnosis not present

## 2017-04-20 DIAGNOSIS — J3089 Other allergic rhinitis: Secondary | ICD-10-CM | POA: Diagnosis not present

## 2017-04-20 DIAGNOSIS — J301 Allergic rhinitis due to pollen: Secondary | ICD-10-CM | POA: Diagnosis not present

## 2017-04-25 DIAGNOSIS — J3089 Other allergic rhinitis: Secondary | ICD-10-CM | POA: Diagnosis not present

## 2017-04-25 DIAGNOSIS — J301 Allergic rhinitis due to pollen: Secondary | ICD-10-CM | POA: Diagnosis not present

## 2017-05-02 DIAGNOSIS — J3089 Other allergic rhinitis: Secondary | ICD-10-CM | POA: Diagnosis not present

## 2017-05-02 DIAGNOSIS — J301 Allergic rhinitis due to pollen: Secondary | ICD-10-CM | POA: Diagnosis not present

## 2017-05-12 DIAGNOSIS — J301 Allergic rhinitis due to pollen: Secondary | ICD-10-CM | POA: Diagnosis not present

## 2017-05-12 DIAGNOSIS — J3089 Other allergic rhinitis: Secondary | ICD-10-CM | POA: Diagnosis not present

## 2017-05-18 DIAGNOSIS — J3089 Other allergic rhinitis: Secondary | ICD-10-CM | POA: Diagnosis not present

## 2017-05-18 DIAGNOSIS — J301 Allergic rhinitis due to pollen: Secondary | ICD-10-CM | POA: Diagnosis not present

## 2017-05-23 DIAGNOSIS — J301 Allergic rhinitis due to pollen: Secondary | ICD-10-CM | POA: Diagnosis not present

## 2017-05-23 DIAGNOSIS — J3089 Other allergic rhinitis: Secondary | ICD-10-CM | POA: Diagnosis not present

## 2017-06-06 DIAGNOSIS — J301 Allergic rhinitis due to pollen: Secondary | ICD-10-CM | POA: Diagnosis not present

## 2017-06-06 DIAGNOSIS — J3089 Other allergic rhinitis: Secondary | ICD-10-CM | POA: Diagnosis not present

## 2017-06-12 DIAGNOSIS — J301 Allergic rhinitis due to pollen: Secondary | ICD-10-CM | POA: Diagnosis not present

## 2017-06-13 DIAGNOSIS — J301 Allergic rhinitis due to pollen: Secondary | ICD-10-CM | POA: Diagnosis not present

## 2017-06-13 DIAGNOSIS — J3089 Other allergic rhinitis: Secondary | ICD-10-CM | POA: Diagnosis not present

## 2017-06-20 DIAGNOSIS — J3089 Other allergic rhinitis: Secondary | ICD-10-CM | POA: Diagnosis not present

## 2017-06-20 DIAGNOSIS — J301 Allergic rhinitis due to pollen: Secondary | ICD-10-CM | POA: Diagnosis not present

## 2017-06-22 DIAGNOSIS — J301 Allergic rhinitis due to pollen: Secondary | ICD-10-CM | POA: Diagnosis not present

## 2017-06-22 DIAGNOSIS — J3089 Other allergic rhinitis: Secondary | ICD-10-CM | POA: Diagnosis not present

## 2017-06-27 DIAGNOSIS — J301 Allergic rhinitis due to pollen: Secondary | ICD-10-CM | POA: Diagnosis not present

## 2017-06-27 DIAGNOSIS — J3089 Other allergic rhinitis: Secondary | ICD-10-CM | POA: Diagnosis not present

## 2017-06-28 DIAGNOSIS — L720 Epidermal cyst: Secondary | ICD-10-CM | POA: Diagnosis not present

## 2017-06-28 DIAGNOSIS — D225 Melanocytic nevi of trunk: Secondary | ICD-10-CM | POA: Diagnosis not present

## 2017-06-28 DIAGNOSIS — D485 Neoplasm of uncertain behavior of skin: Secondary | ICD-10-CM | POA: Diagnosis not present

## 2017-06-29 DIAGNOSIS — H1045 Other chronic allergic conjunctivitis: Secondary | ICD-10-CM | POA: Diagnosis not present

## 2017-06-29 DIAGNOSIS — J301 Allergic rhinitis due to pollen: Secondary | ICD-10-CM | POA: Diagnosis not present

## 2017-06-29 DIAGNOSIS — J3089 Other allergic rhinitis: Secondary | ICD-10-CM | POA: Diagnosis not present

## 2017-06-30 ENCOUNTER — Ambulatory Visit (INDEPENDENT_AMBULATORY_CARE_PROVIDER_SITE_OTHER): Payer: BLUE CROSS/BLUE SHIELD | Admitting: Family Medicine

## 2017-06-30 ENCOUNTER — Encounter: Payer: Self-pay | Admitting: Family Medicine

## 2017-06-30 VITALS — BP 126/84 | HR 80 | Temp 97.9°F | Resp 16 | Ht >= 80 in | Wt 367.0 lb

## 2017-06-30 DIAGNOSIS — Z0000 Encounter for general adult medical examination without abnormal findings: Secondary | ICD-10-CM | POA: Diagnosis not present

## 2017-06-30 DIAGNOSIS — Z23 Encounter for immunization: Secondary | ICD-10-CM

## 2017-06-30 LAB — POCT URINALYSIS DIPSTICK
BILIRUBIN UA: NEGATIVE
Blood, UA: NEGATIVE
GLUCOSE UA: NEGATIVE
KETONES UA: NEGATIVE
LEUKOCYTES UA: NEGATIVE
Nitrite, UA: NEGATIVE
PH UA: 6.5 (ref 5.0–8.0)
Protein, UA: NEGATIVE
Spec Grav, UA: 1.015 (ref 1.010–1.025)
Urobilinogen, UA: 0.2 E.U./dL

## 2017-06-30 NOTE — Progress Notes (Signed)
Patient: Andrew Lazenby., Male    DOB: 1976/07/18, 41 y.o.   MRN: [id number] Visit Date: 06/30/2017  Today's Provider: Wilhemena Durie, MD   Chief Complaint  Patient presents with  . Annual Exam   Subjective:    Annual physical exam Andrew Pilar. is a 41 y.o. male who presents today for health maintenance and complete physical. He feels well. He reports exercising not regularly. He reports he is sleeping fairly well. Active but no exercise.    Review of Systems  Constitutional: Negative.   HENT: Negative.   Eyes: Negative.   Respiratory: Negative.   Cardiovascular: Negative.   Gastrointestinal: Negative.   Endocrine: Negative.   Genitourinary: Negative.   Musculoskeletal: Positive for arthralgias.  Allergic/Immunologic: Negative.   Neurological: Negative.   Hematological: Negative.   Psychiatric/Behavioral: Negative.     Social History      He  reports that he quit smoking about 4 years ago. He has a 10.00 pack-year smoking history. He quit smokeless tobacco use about 2 years ago. His smokeless tobacco use included snuff. He reports that he does not drink alcohol or use drugs.       Social History   Socioeconomic History  . Marital status: Single    Spouse name: None  . Number of children: None  . Years of education: None  . Highest education level: None  Social Needs  . Financial resource strain: None  . Food insecurity - worry: None  . Food insecurity - inability: None  . Transportation needs - medical: None  . Transportation needs - non-medical: None  Occupational History  . None  Tobacco Use  . Smoking status: Former Smoker    Packs/day: 1.00    Years: 10.00    Pack years: 10.00    Last attempt to quit: 07/24/2012    Years since quitting: 4.9  . Smokeless tobacco: Former Systems developer    Types: Snuff    Quit date: 01/13/2015  Substance and Sexual Activity  . Alcohol use: No  . Drug use: No  . Sexual activity: None  Other Topics Concern   . None  Social History Narrative  . None    Past Medical History:  Diagnosis Date  . Allergy      Patient Active Problem List   Diagnosis Date Noted  . Allergic rhinitis 01/14/2015  . Anxiety, generalized 01/14/2015  . Cannot sleep 01/14/2015  . 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 Adiposity 01/14/2015    Past Surgical History:  Procedure Laterality Date  . KNEE SURGERY Left     Family History        Family Status  Relation Name Status  . Mother  Alive  . Father  Alive       pre diabetes  . Sister  Alive  . MGM  Alive  . MGF  Deceased  . PGM  Deceased  . PGF  Deceased        His family history includes Alzheimer's disease in his paternal grandmother; Dementia in his paternal grandmother; Heart attack in his maternal grandfather; Heart disease in his maternal grandmother; Hypertension in his mother.     No Known Allergies   Current Outpatient Medications:  .  DYMISTA 137-50 MCG/ACT SUSP, , Disp: , Rfl: 2 .  levocetirizine (XYZAL) 5 MG tablet, Take 5 mg by mouth every evening., Disp: , Rfl: 3 .  montelukast (SINGULAIR) 10 MG tablet, Take 10 mg by mouth every evening., Disp: ,  Rfl: 3 .  naproxen (NAPROSYN) 500 MG tablet, TAKE 1 TABLET (500 MG TOTAL) BY MOUTH 2 (TWO) TIMES DAILY WITH A MEAL., Disp: 60 tablet, Rfl: 0   Patient Care Team: Jerrol Banana., MD as PCP - General (Family Medicine)      Objective:   Vitals: BP 126/84 (BP Location: Left Arm, Patient Position: Sitting, Cuff Size: Large)   Pulse 80   Temp 97.9 F (36.6 C)   Resp 16   Ht 6\' 8"  (2.032 m)   Wt (!) 367 lb (166.5 kg)   BMI 40.32 kg/m    Vitals:   06/30/17 0844  BP: 126/84  Pulse: 80  Resp: 16  Temp: 97.9 F (36.6 C)  Weight: (!) 367 lb (166.5 kg)  Height: 6\' 8"  (2.032 m)     Physical Exam  Constitutional: He is oriented to person, place, and time. He appears well-developed and well-nourished.  HENT:  Head: Normocephalic and atraumatic.  Right Ear: External ear normal.    Left Ear: External ear normal.  Nose: Nose normal.  Mouth/Throat: Oropharynx is clear and moist.  Eyes: Conjunctivae and EOM are normal. Pupils are equal, round, and reactive to light.  Neck: Normal range of motion. Neck supple.  Cardiovascular: Normal rate, regular rhythm and normal heart sounds.  Pulmonary/Chest: Effort normal and breath sounds normal.  Abdominal: Soft. Bowel sounds are normal.  Genitourinary: Rectum normal, prostate normal and penis normal. Rectal exam shows guaiac negative stool.  Genitourinary Comments: hemoccult was negative.   Musculoskeletal: Normal range of motion.  Neurological: He is alert and oriented to person, place, and time.  Skin: Skin is warm and dry.  Psychiatric: He has a normal mood and affect. His behavior is normal. Judgment and thought content normal.     Depression Screen PHQ 2/9 Scores 06/30/2017 07/01/2015  PHQ - 2 Score 0 0      Assessment & Plan:     Routine Health Maintenance and Physical Exam  Exercise Activities and Dietary recommendations Goals    None      Immunization History  Administered Date(s) Administered  . Tdap 01/10/2007    Health Maintenance  Topic Date Due  . TETANUS/TDAP  01/09/2017  . HIV Screening  07/07/2017 (Originally 06/09/1992)  . INFLUENZA VACCINE  10/23/2017 (Originally 02/22/2017)     Discussed health benefits of physical activity, and encouraged him to engage in regular exercise appropriate for his age and condition.    1. Annual physical exam - CBC with Differential/Platelet - Comprehensive metabolic panel - Lipid panel - TSH - PSA - POCT urinalysis dipstick  2. Need for vaccine for Td (tetanus-diphtheria) - Td : Tetanus/diphtheria >7yo Preservative  free 3.Obesity Goal waist of 40 in this large 74ft 8inch man.   I have done the exam and reviewed the above chart and it is accurate to the best of my knowledge. Development worker, community has been used in this note in any air is in the  dictation or transcription are unintentional.  Wilhemena Durie, MD  [facility]

## 2017-07-01 LAB — COMPLETE METABOLIC PANEL WITH GFR
AG RATIO: 1.9 (calc) (ref 1.0–2.5)
ALBUMIN MSPROF: 4.4 g/dL (ref 3.6–5.1)
ALT: 29 U/L (ref 9–46)
AST: 23 U/L (ref 10–40)
Alkaline phosphatase (APISO): 41 U/L (ref 40–115)
BUN: 17 mg/dL (ref 7–25)
CALCIUM: 9.3 mg/dL (ref 8.6–10.3)
CO2: 25 mmol/L (ref 20–32)
CREATININE: 1.17 mg/dL (ref 0.60–1.35)
Chloride: 106 mmol/L (ref 98–110)
GFR, EST AFRICAN AMERICAN: 90 mL/min/{1.73_m2} (ref >=60)
GFR, EST NON AFRICAN AMERICAN: 78 mL/min/{1.73_m2} (ref >=60)
GLOBULIN: 2.3 g/dL (ref 1.9–3.7)
Glucose, Bld: 100 mg/dL — ABNORMAL HIGH (ref 65–99)
POTASSIUM: 4 mmol/L (ref 3.5–5.3)
SODIUM: 140 mmol/L (ref 135–146)
Total Bilirubin: 0.8 mg/dL (ref 0.2–1.2)
Total Protein: 6.7 g/dL (ref 6.1–8.1)

## 2017-07-01 LAB — CBC WITH DIFFERENTIAL/PLATELET
Basophils Absolute: 41 cells/uL (ref 0–200)
Basophils Relative: 0.9 %
Eosinophils Absolute: 81 cells/uL (ref 15–500)
Eosinophils Relative: 1.8 %
HCT: 41.6 % (ref 38.5–50.0)
Hemoglobin: 14.3 g/dL (ref 13.2–17.1)
LYMPHS ABS: 909 {cells}/uL (ref 850–3900)
MCH: 29.5 pg (ref 27.0–33.0)
MCHC: 34.4 g/dL (ref 32.0–36.0)
MCV: 85.8 fL (ref 80.0–100.0)
MPV: 11.1 fL (ref 7.5–12.5)
Monocytes Relative: 10.6 %
NEUTROS ABS: 2993 {cells}/uL (ref 1500–7800)
Neutrophils Relative %: 66.5 %
PLATELETS: 159 10*3/uL (ref 140–400)
RBC: 4.85 10*6/uL (ref 4.20–5.80)
RDW: 13.1 % (ref 11.0–15.0)
Total Lymphocyte: 20.2 %
WBC: 4.5 10*3/uL (ref 3.8–10.8)
WBCMIX: 477 {cells}/uL (ref 200–950)

## 2017-07-01 LAB — PSA: PSA: 0.5 ng/mL (ref <=4.0)

## 2017-07-01 LAB — LIPID PANEL
Cholesterol: 201 mg/dL — ABNORMAL HIGH (ref <200)
HDL: 42 mg/dL (ref >40)
LDL Cholesterol (Calc): 126 mg/dL (calc) — ABNORMAL HIGH
NON-HDL CHOLESTEROL (CALC): 159 mg/dL — AB (ref <130)
TRIGLYCERIDES: 211 mg/dL — AB (ref <150)
Total CHOL/HDL Ratio: 4.8 (calc) (ref <5.0)

## 2017-07-01 LAB — TSH: TSH: 1.19 m[IU]/L (ref 0.40–4.50)

## 2017-07-03 ENCOUNTER — Encounter: Payer: BLUE CROSS/BLUE SHIELD | Admitting: Family Medicine

## 2017-07-11 ENCOUNTER — Telehealth: Payer: Self-pay

## 2017-07-11 DIAGNOSIS — J301 Allergic rhinitis due to pollen: Secondary | ICD-10-CM | POA: Diagnosis not present

## 2017-07-11 DIAGNOSIS — J3089 Other allergic rhinitis: Secondary | ICD-10-CM | POA: Diagnosis not present

## 2017-07-11 NOTE — Telephone Encounter (Signed)
Patient advised as below.  

## 2017-07-11 NOTE — Telephone Encounter (Signed)
-----   Message from Jerrol Banana., MD sent at 07/10/2017  4:23 PM EST ----- Labs OK--D and E as discussed.

## 2017-07-20 DIAGNOSIS — J301 Allergic rhinitis due to pollen: Secondary | ICD-10-CM | POA: Diagnosis not present

## 2017-07-20 DIAGNOSIS — J3089 Other allergic rhinitis: Secondary | ICD-10-CM | POA: Diagnosis not present

## 2017-07-27 DIAGNOSIS — J3089 Other allergic rhinitis: Secondary | ICD-10-CM | POA: Diagnosis not present

## 2017-07-27 DIAGNOSIS — J301 Allergic rhinitis due to pollen: Secondary | ICD-10-CM | POA: Diagnosis not present

## 2017-08-08 DIAGNOSIS — J3089 Other allergic rhinitis: Secondary | ICD-10-CM | POA: Diagnosis not present

## 2017-08-08 DIAGNOSIS — J301 Allergic rhinitis due to pollen: Secondary | ICD-10-CM | POA: Diagnosis not present

## 2017-08-22 DIAGNOSIS — J3089 Other allergic rhinitis: Secondary | ICD-10-CM | POA: Diagnosis not present

## 2017-08-22 DIAGNOSIS — J301 Allergic rhinitis due to pollen: Secondary | ICD-10-CM | POA: Diagnosis not present

## 2017-08-29 DIAGNOSIS — J3089 Other allergic rhinitis: Secondary | ICD-10-CM | POA: Diagnosis not present

## 2017-08-29 DIAGNOSIS — J301 Allergic rhinitis due to pollen: Secondary | ICD-10-CM | POA: Diagnosis not present

## 2017-09-07 ENCOUNTER — Ambulatory Visit (INDEPENDENT_AMBULATORY_CARE_PROVIDER_SITE_OTHER): Payer: BLUE CROSS/BLUE SHIELD | Admitting: Physician Assistant

## 2017-09-07 ENCOUNTER — Encounter: Payer: Self-pay | Admitting: Physician Assistant

## 2017-09-07 VITALS — BP 102/68 | HR 86 | Temp 97.8°F | Resp 16 | Wt 357.0 lb

## 2017-09-07 DIAGNOSIS — R05 Cough: Secondary | ICD-10-CM

## 2017-09-07 DIAGNOSIS — R0982 Postnasal drip: Secondary | ICD-10-CM | POA: Diagnosis not present

## 2017-09-07 DIAGNOSIS — R059 Cough, unspecified: Secondary | ICD-10-CM

## 2017-09-07 MED ORDER — HYDROCODONE-HOMATROPINE 5-1.5 MG/5ML PO SYRP: 5.0000 mL | ORAL_SOLUTION | Freq: Every evening | ORAL | 0 refills | Status: DC | PRN

## 2017-09-07 NOTE — Progress Notes (Signed)
[facility]  Chief Complaint  Patient presents with  . Sore Throat    Subjective:    Patient ID: Andrew Cu., male    DOB: 1976-06-13, 42 y.o.   MRN: [id number]  Upper Respiratory Infection: Emitt Maglione. is a 43 y.o. male with a past medical history significant for allergies on Xyzal, singulair, and Dymista as well as allergy shot, exposure to sick child complaining of symptoms of a URI. Symptoms include congestion, cough and sore throat. Onset of symptoms was 1 week ago, gradually worsening since that time. He also c/o congestion, cough described as productive, low grade fever and shortness of breath for the past 1 week .  He is drinking plenty of fluids. Evaluation to date: none. Treatment to date: cough suppressants. The treatment has provided no relief. He denies heartburn.   Review of Systems  Constitutional: Positive for chills and fatigue. Negative for activity change, appetite change, diaphoresis, fever and unexpected weight change.  HENT: Positive for sore throat. Negative for congestion, ear discharge, ear pain, postnasal drip, rhinorrhea, sinus pressure, sinus pain, sneezing, tinnitus, trouble swallowing and voice change.   Eyes: Negative.   Respiratory: Positive for cough, chest tightness and shortness of breath. Negative for apnea, choking, wheezing and stridor.   Gastrointestinal: Negative.   Neurological: Negative for dizziness, light-headedness and headaches.       Objective:   BP 102/68 (BP Location: Right Arm, Patient Position: Sitting, Cuff Size: Normal)   Pulse 86   Temp 97.8 F (36.6 C) (Oral)   Resp 16   Wt (!) 357 lb (161.9 kg)   SpO2 96%   BMI 39.22 kg/m   Patient Active Problem List   Diagnosis Date Noted  . Allergic rhinitis 01/14/2015  . Anxiety, generalized 01/14/2015  . Cannot sleep 01/14/2015  . 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 Adiposity 01/14/2015    Outpatient Encounter Medications as of  09/07/2017  Medication Sig Note  . DYMISTA 137-50 MCG/ACT SUSP  07/01/2015: Received from: External Pharmacy  . levocetirizine (XYZAL) 5 MG tablet Take 5 mg by mouth every evening. 07/01/2015: Received from: External Pharmacy  . montelukast (SINGULAIR) 10 MG tablet Take 10 mg by mouth every evening. 07/01/2015: Received from: External Pharmacy  . naproxen (NAPROSYN) 500 MG tablet TAKE 1 TABLET (500 MG TOTAL) BY MOUTH 2 (TWO) TIMES DAILY WITH A MEAL.   Marland Kitchen HYDROcodone-homatropine (HYCODAN) 5-1.5 MG/5ML syrup Take 5 mLs by mouth at bedtime as needed for cough.    No facility-administered encounter medications on file as of 09/07/2017.     No Known Allergies     Physical Exam  Constitutional: He is oriented to person, place, and time. He appears well-developed and well-nourished.  HENT:  Head: Normocephalic.  Right Ear: Tympanic membrane and external ear normal.  Left Ear: Tympanic membrane and external ear normal.  Mouth/Throat: Oropharynx is clear and moist. No oropharyngeal exudate.  Eyes: Conjunctivae are normal.  Neck: Neck supple.  Cardiovascular: Normal rate and regular rhythm.  Pulmonary/Chest: Effort normal and breath sounds normal.  Lymphadenopathy:    He has no cervical adenopathy.  Neurological: He is alert and oriented to person, place, and time.  Skin: Skin is warm and dry.  Psychiatric: He has a normal mood and affect. His behavior is normal.       Assessment & Plan:   1. PND (post-nasal drip)  Seems like increased PND due to URI. Can take some sudafed to help with congestion and PND  x 3 days. May also take cough medication at night.   2. Cough  - HYDROcodone-homatropine (HYCODAN) 5-1.5 MG/5ML syrup; Take 5 mLs by mouth at bedtime as needed for cough.  Dispense: 120 mL; Refill: 0   Patient Instructions  Cough, Adult A cough helps to clear your throat and lungs. A cough may last only 2-3 weeks (acute), or it may last longer than 8 weeks (chronic). Many different  things can cause a cough. A cough may be a sign of an illness or another medical condition. Follow these instructions at home:  Pay attention to any changes in your cough.  Take medicines only as told by your doctor. ? If you were prescribed an antibiotic medicine, take it as told by your doctor. Do not stop taking it even if you start to feel better. ? Talk with your doctor before you try using a cough medicine.  Drink enough fluid to keep your pee (urine) clear or pale yellow.  If the air is dry, use a cold steam vaporizer or humidifier in your home.  Stay away from things that make you cough at work or at home.  If your cough is worse at night, try using extra pillows to raise your head up higher while you sleep.  Do not smoke, and try not to be around smoke. If you need help quitting, ask your doctor.  Do not have caffeine.  Do not drink alcohol.  Rest as needed. Contact a doctor if:  You have new problems (symptoms).  You cough up yellow fluid (pus).  Your cough does not get better after 2-3 weeks, or your cough gets worse.  Medicine does not help your cough and you are not sleeping well.  You have pain that gets worse or pain that is not helped with medicine.  You have a fever.  You are losing weight and you do not know why.  You have night sweats. Get help right away if:  You cough up blood.  You have trouble breathing.  Your heartbeat is very fast. This information is not intended to replace advice given to you by your health care provider. Make sure you discuss any questions you have with your health care provider. Document Released: 03/24/2011 Document Revised: 12/17/2015 Document Reviewed: 09/17/2014 Elsevier Interactive Patient Education  [employer].     The entirety of the information documented in the History of Present Illness, Review of Systems and Physical Exam were personally obtained by me. Portions of this information were initially  documented by Ashley Royalty, CMA and reviewed by me for thoroughness and accuracy.

## 2017-09-07 NOTE — Patient Instructions (Signed)

## 2017-09-12 DIAGNOSIS — J3089 Other allergic rhinitis: Secondary | ICD-10-CM | POA: Diagnosis not present

## 2017-09-12 DIAGNOSIS — J301 Allergic rhinitis due to pollen: Secondary | ICD-10-CM | POA: Diagnosis not present

## 2017-09-21 DIAGNOSIS — J301 Allergic rhinitis due to pollen: Secondary | ICD-10-CM | POA: Diagnosis not present

## 2017-09-21 DIAGNOSIS — J3089 Other allergic rhinitis: Secondary | ICD-10-CM | POA: Diagnosis not present

## 2017-09-26 DIAGNOSIS — J3089 Other allergic rhinitis: Secondary | ICD-10-CM | POA: Diagnosis not present

## 2017-09-26 DIAGNOSIS — J301 Allergic rhinitis due to pollen: Secondary | ICD-10-CM | POA: Diagnosis not present

## 2017-10-06 DIAGNOSIS — J301 Allergic rhinitis due to pollen: Secondary | ICD-10-CM | POA: Diagnosis not present

## 2017-10-06 DIAGNOSIS — J3089 Other allergic rhinitis: Secondary | ICD-10-CM | POA: Diagnosis not present

## 2017-10-10 DIAGNOSIS — J3089 Other allergic rhinitis: Secondary | ICD-10-CM | POA: Diagnosis not present

## 2017-10-10 DIAGNOSIS — J301 Allergic rhinitis due to pollen: Secondary | ICD-10-CM | POA: Diagnosis not present

## 2017-10-27 DIAGNOSIS — J301 Allergic rhinitis due to pollen: Secondary | ICD-10-CM | POA: Diagnosis not present

## 2017-10-27 DIAGNOSIS — J3089 Other allergic rhinitis: Secondary | ICD-10-CM | POA: Diagnosis not present

## 2017-11-02 ENCOUNTER — Encounter: Payer: Self-pay | Admitting: Family Medicine

## 2017-11-02 ENCOUNTER — Ambulatory Visit: Payer: BLUE CROSS/BLUE SHIELD | Admitting: Family Medicine

## 2017-11-02 VITALS — BP 108/80 | HR 98 | Temp 98.3°F | Resp 16 | Wt 363.0 lb

## 2017-11-02 DIAGNOSIS — R1030 Lower abdominal pain, unspecified: Secondary | ICD-10-CM | POA: Diagnosis not present

## 2017-11-02 DIAGNOSIS — R350 Frequency of micturition: Secondary | ICD-10-CM | POA: Diagnosis not present

## 2017-11-02 LAB — POCT URINALYSIS DIPSTICK
Glucose, UA: NEGATIVE
KETONES UA: NEGATIVE
Leukocytes, UA: NEGATIVE
Nitrite, UA: NEGATIVE
PH UA: 6 (ref 5.0–8.0)
RBC UA: NEGATIVE
Spec Grav, UA: 1.01 (ref 1.010–1.025)
UROBILINOGEN UA: 1 U/dL

## 2017-11-02 MED ORDER — CIPROFLOXACIN HCL 500 MG PO TABS: 500.0000 mg | ORAL_TABLET | Freq: Two times a day (BID) | ORAL | 0 refills | Status: DC

## 2017-11-02 MED ORDER — METRONIDAZOLE 500 MG PO TABS: 500.0000 mg | ORAL_TABLET | Freq: Three times a day (TID) | ORAL | 0 refills | Status: DC

## 2017-11-02 NOTE — Progress Notes (Signed)
Subjective:     Patient ID: Andrew Andersen., male   DOB: 05-Sep-1975, 42 y.o.   MRN: [id number] Chief Complaint  Patient presents with  . Urinary Frequency    Patient comes in office today with complaints of lower abdominal pain and pelvic pressure that began last night. Patient reports symptoms of fatigue and urinary frequency this morning.   HPI Reports he has had intermittent similar sx in the past which were minor and just lasted a day or two. Reports having possible prostatitis years ago but no personal hx or family hx of diverticulosis. Reports bowel movements are more frequent but formed. Currently not sexually active.  Review of Systems     Objective:   Physical Exam  Constitutional: He appears well-developed and well-nourished. No distress.  Abdominal: Soft. There is no tenderness. There is no guarding.  Localizes discomfort to his midline lower abdominal area         Assessment:    1. Lower abdominal pain: will treat empirically for diverticulitis - ciprofloxacin (CIPRO) 500 MG tablet; Take 1 tablet (500 mg total) by mouth 2 (two) times daily.  Dispense: 14 tablet; Refill: 0 - metroNIDAZOLE (FLAGYL) 500 MG tablet; Take 1 tablet (500 mg total) by mouth 3 (three) times daily.  Dispense: 21 tablet; Refill: 0    Plan:    Handout regarding diverticulitis. Discussed putting bowel to rest. Consider imaging if not improving. Told to report to ER over the weekend if worse despite abx.

## 2017-11-02 NOTE — Patient Instructions (Addendum)
Put your bowel to rest for 24-48 hours with liquids and soups. Diverticulitis Diverticulitis is when small pockets in your large intestine (colon) get infected or swollen. This causes stomach pain and watery poop (diarrhea). These pouches are called diverticula. They form in people who have a condition called diverticulosis. Follow these instructions at home: Medicines  Take over-the-counter and prescription medicines only as told by your doctor. These include: ? Antibiotics. ? Pain medicines. ? Fiber pills. ? Probiotics. ? Stool softeners.  Do not drive or use heavy machinery while taking prescription pain medicine.  If you were prescribed an antibiotic, take it as told. Do not stop taking it even if you feel better. General instructions  Follow a diet as told by your doctor.  When you feel better, your doctor may tell you to change your diet. You may need to eat a lot of fiber. Fiber makes it easier to poop (have bowel movements). Healthy foods with fiber include: ? Berries. ? Beans. ? Lentils. ? Green vegetables.  Exercise 3 or more times a week. Aim for 30 minutes each time. Exercise enough to sweat and make your heart beat faster.  Keep all follow-up visits as told. This is important. You may need to have an exam of the large intestine. This is called a colonoscopy. Contact a doctor if:  Your pain does not get better.  You have a hard time eating or drinking.  You are not pooping like normal. Get help right away if:  Your pain gets worse.  Your problems do not get better.  Your problems get worse very fast.  You have a fever.  You throw up (vomit) more than one time.  You have poop that is: ? Bloody. ? Black. ? Tarry. Summary  Diverticulitis is when small pockets in your large intestine (colon) get infected or swollen.  Take medicines only as told by your doctor.  Follow a diet as told by your doctor. This information is not intended to replace advice  given to you by your health care provider. Make sure you discuss any questions you have with your health care provider. Document Released: 12/28/2007 Document Revised: 07/28/2016 Document Reviewed: 07/28/2016 Elsevier Interactive Patient Education  2017 [employer].

## 2017-11-06 ENCOUNTER — Telehealth: Payer: Self-pay | Admitting: Family Medicine

## 2017-11-06 NOTE — Telephone Encounter (Signed)
Called to see of lower abdominal pain was improving with empiric tx of diverticulitis. States symptoms improving and he is tolerating the medication.

## 2017-11-07 DIAGNOSIS — J3089 Other allergic rhinitis: Secondary | ICD-10-CM | POA: Diagnosis not present

## 2017-11-07 DIAGNOSIS — J301 Allergic rhinitis due to pollen: Secondary | ICD-10-CM | POA: Diagnosis not present

## 2017-11-17 DIAGNOSIS — J3089 Other allergic rhinitis: Secondary | ICD-10-CM | POA: Diagnosis not present

## 2017-11-17 DIAGNOSIS — J301 Allergic rhinitis due to pollen: Secondary | ICD-10-CM | POA: Diagnosis not present

## 2017-11-23 DIAGNOSIS — J301 Allergic rhinitis due to pollen: Secondary | ICD-10-CM | POA: Diagnosis not present

## 2017-11-23 DIAGNOSIS — J3089 Other allergic rhinitis: Secondary | ICD-10-CM | POA: Diagnosis not present

## 2017-11-28 DIAGNOSIS — J3089 Other allergic rhinitis: Secondary | ICD-10-CM | POA: Diagnosis not present

## 2017-11-28 DIAGNOSIS — J301 Allergic rhinitis due to pollen: Secondary | ICD-10-CM | POA: Diagnosis not present

## 2017-12-07 DIAGNOSIS — J301 Allergic rhinitis due to pollen: Secondary | ICD-10-CM | POA: Diagnosis not present

## 2017-12-07 DIAGNOSIS — J3089 Other allergic rhinitis: Secondary | ICD-10-CM | POA: Diagnosis not present

## 2017-12-12 DIAGNOSIS — J3089 Other allergic rhinitis: Secondary | ICD-10-CM | POA: Diagnosis not present

## 2017-12-12 DIAGNOSIS — J301 Allergic rhinitis due to pollen: Secondary | ICD-10-CM | POA: Diagnosis not present

## 2018-01-02 DIAGNOSIS — J3089 Other allergic rhinitis: Secondary | ICD-10-CM | POA: Diagnosis not present

## 2018-01-02 DIAGNOSIS — J301 Allergic rhinitis due to pollen: Secondary | ICD-10-CM | POA: Diagnosis not present

## 2018-01-09 DIAGNOSIS — J301 Allergic rhinitis due to pollen: Secondary | ICD-10-CM | POA: Diagnosis not present

## 2018-01-09 DIAGNOSIS — J3089 Other allergic rhinitis: Secondary | ICD-10-CM | POA: Diagnosis not present

## 2018-01-16 DIAGNOSIS — J3089 Other allergic rhinitis: Secondary | ICD-10-CM | POA: Diagnosis not present

## 2018-01-16 DIAGNOSIS — J301 Allergic rhinitis due to pollen: Secondary | ICD-10-CM | POA: Diagnosis not present

## 2018-01-19 DIAGNOSIS — J3089 Other allergic rhinitis: Secondary | ICD-10-CM | POA: Diagnosis not present

## 2018-01-19 DIAGNOSIS — J301 Allergic rhinitis due to pollen: Secondary | ICD-10-CM | POA: Diagnosis not present

## 2018-02-06 DIAGNOSIS — J301 Allergic rhinitis due to pollen: Secondary | ICD-10-CM | POA: Diagnosis not present

## 2018-02-06 DIAGNOSIS — J3089 Other allergic rhinitis: Secondary | ICD-10-CM | POA: Diagnosis not present

## 2018-02-09 DIAGNOSIS — J3089 Other allergic rhinitis: Secondary | ICD-10-CM | POA: Diagnosis not present

## 2018-02-09 DIAGNOSIS — J301 Allergic rhinitis due to pollen: Secondary | ICD-10-CM | POA: Diagnosis not present

## 2018-02-16 DIAGNOSIS — J301 Allergic rhinitis due to pollen: Secondary | ICD-10-CM | POA: Diagnosis not present

## 2018-02-16 DIAGNOSIS — J3089 Other allergic rhinitis: Secondary | ICD-10-CM | POA: Diagnosis not present

## 2018-02-23 DIAGNOSIS — J301 Allergic rhinitis due to pollen: Secondary | ICD-10-CM | POA: Diagnosis not present

## 2018-02-23 DIAGNOSIS — J3089 Other allergic rhinitis: Secondary | ICD-10-CM | POA: Diagnosis not present

## 2018-03-01 DIAGNOSIS — J301 Allergic rhinitis due to pollen: Secondary | ICD-10-CM | POA: Diagnosis not present

## 2018-03-01 DIAGNOSIS — J3089 Other allergic rhinitis: Secondary | ICD-10-CM | POA: Diagnosis not present

## 2018-03-06 DIAGNOSIS — J3089 Other allergic rhinitis: Secondary | ICD-10-CM | POA: Diagnosis not present

## 2018-03-06 DIAGNOSIS — J301 Allergic rhinitis due to pollen: Secondary | ICD-10-CM | POA: Diagnosis not present

## 2018-03-07 DIAGNOSIS — J301 Allergic rhinitis due to pollen: Secondary | ICD-10-CM | POA: Diagnosis not present

## 2018-03-08 DIAGNOSIS — J3089 Other allergic rhinitis: Secondary | ICD-10-CM | POA: Diagnosis not present

## 2018-03-20 DIAGNOSIS — J301 Allergic rhinitis due to pollen: Secondary | ICD-10-CM | POA: Diagnosis not present

## 2018-03-20 DIAGNOSIS — J3089 Other allergic rhinitis: Secondary | ICD-10-CM | POA: Diagnosis not present

## 2018-03-29 DIAGNOSIS — J301 Allergic rhinitis due to pollen: Secondary | ICD-10-CM | POA: Diagnosis not present

## 2018-03-29 DIAGNOSIS — J3089 Other allergic rhinitis: Secondary | ICD-10-CM | POA: Diagnosis not present

## 2018-04-03 DIAGNOSIS — J3089 Other allergic rhinitis: Secondary | ICD-10-CM | POA: Diagnosis not present

## 2018-04-03 DIAGNOSIS — J301 Allergic rhinitis due to pollen: Secondary | ICD-10-CM | POA: Diagnosis not present

## 2018-04-05 DIAGNOSIS — J301 Allergic rhinitis due to pollen: Secondary | ICD-10-CM | POA: Diagnosis not present

## 2018-04-05 DIAGNOSIS — J3089 Other allergic rhinitis: Secondary | ICD-10-CM | POA: Diagnosis not present

## 2018-04-10 DIAGNOSIS — J301 Allergic rhinitis due to pollen: Secondary | ICD-10-CM | POA: Diagnosis not present

## 2018-04-10 DIAGNOSIS — J3089 Other allergic rhinitis: Secondary | ICD-10-CM | POA: Diagnosis not present

## 2018-04-12 DIAGNOSIS — J3089 Other allergic rhinitis: Secondary | ICD-10-CM | POA: Diagnosis not present

## 2018-04-12 DIAGNOSIS — J301 Allergic rhinitis due to pollen: Secondary | ICD-10-CM | POA: Diagnosis not present

## 2018-04-17 DIAGNOSIS — J301 Allergic rhinitis due to pollen: Secondary | ICD-10-CM | POA: Diagnosis not present

## 2018-04-17 DIAGNOSIS — J3089 Other allergic rhinitis: Secondary | ICD-10-CM | POA: Diagnosis not present

## 2018-04-26 DIAGNOSIS — J301 Allergic rhinitis due to pollen: Secondary | ICD-10-CM | POA: Diagnosis not present

## 2018-04-26 DIAGNOSIS — J3089 Other allergic rhinitis: Secondary | ICD-10-CM | POA: Diagnosis not present

## 2018-05-11 DIAGNOSIS — J3089 Other allergic rhinitis: Secondary | ICD-10-CM | POA: Diagnosis not present

## 2018-05-11 DIAGNOSIS — J301 Allergic rhinitis due to pollen: Secondary | ICD-10-CM | POA: Diagnosis not present

## 2018-05-15 DIAGNOSIS — J3089 Other allergic rhinitis: Secondary | ICD-10-CM | POA: Diagnosis not present

## 2018-05-15 DIAGNOSIS — J301 Allergic rhinitis due to pollen: Secondary | ICD-10-CM | POA: Diagnosis not present

## 2018-05-22 ENCOUNTER — Ambulatory Visit: Payer: BLUE CROSS/BLUE SHIELD | Admitting: Family Medicine

## 2018-05-22 ENCOUNTER — Encounter: Payer: Self-pay | Admitting: Family Medicine

## 2018-05-22 VITALS — BP 126/86 | HR 80 | Temp 97.7°F | Resp 16 | Wt 351.0 lb

## 2018-05-22 DIAGNOSIS — N401 Enlarged prostate with lower urinary tract symptoms: Secondary | ICD-10-CM | POA: Diagnosis not present

## 2018-05-22 DIAGNOSIS — Z6838 Body mass index (BMI) 38.0-38.9, adult: Secondary | ICD-10-CM | POA: Diagnosis not present

## 2018-05-22 DIAGNOSIS — J3089 Other allergic rhinitis: Secondary | ICD-10-CM | POA: Diagnosis not present

## 2018-05-22 DIAGNOSIS — E6609 Other obesity due to excess calories: Secondary | ICD-10-CM

## 2018-05-22 DIAGNOSIS — J301 Allergic rhinitis due to pollen: Secondary | ICD-10-CM | POA: Diagnosis not present

## 2018-05-22 NOTE — Progress Notes (Signed)
Patient: Andrew Andersen. Male    DOB: 08/31/1975   42 y.o.   MRN: [id number] Visit Date: 05/22/2018  Today's Provider: Wilhemena Durie, MD   Chief Complaint  Patient presents with  . Urinary Frequency   Subjective:    HPI   Pt comes in today concern that he has a flare up of prostatitis.  He says he has had a history of this in the past. He is complaining for lower abdominal and pelvic pain.      No Known Allergies   Current Outpatient Medications:  .  DYMISTA 137-50 MCG/ACT SUSP, , Disp: , Rfl: 2 .  levocetirizine (XYZAL) 5 MG tablet, Take 5 mg by mouth every evening., Disp: , Rfl: 3 .  montelukast (SINGULAIR) 10 MG tablet, Take 10 mg by mouth every evening., Disp: , Rfl: 3 .  ciprofloxacin (CIPRO) 500 MG tablet, Take 1 tablet (500 mg total) by mouth 2 (two) times daily., Disp: 14 tablet, Rfl: 0 .  metroNIDAZOLE (FLAGYL) 500 MG tablet, Take 1 tablet (500 mg total) by mouth 3 (three) times daily., Disp: 21 tablet, Rfl: 0  Review of Systems  Constitutional: Negative for activity change, appetite change, chills, diaphoresis, fatigue, fever and unexpected weight change.  Eyes: Negative.   Respiratory: Negative.   Cardiovascular: Negative.   Gastrointestinal: Negative for abdominal distention, abdominal pain, anal bleeding, blood in stool, constipation, diarrhea, nausea, rectal pain and vomiting.  Endocrine: Negative.   Genitourinary: Positive for frequency and urgency. Negative for decreased urine volume, difficulty urinating, discharge, dysuria, hematuria, penile pain, penile swelling, scrotal swelling and testicular pain.  Musculoskeletal: Positive for back pain.  Allergic/Immunologic: Negative.   Neurological: Negative for dizziness, light-headedness and headaches.  Psychiatric/Behavioral: Negative.     Social History   Tobacco Use  . Smoking status: Former Smoker    Packs/day: 1.00    Years: 10.00    Pack years: 10.00    Last attempt to quit:  07/24/2012    Years since quitting: 5.8  . Smokeless tobacco: Former Systems developer    Types: Snuff    Quit date: 01/13/2015  Substance Use Topics  . Alcohol use: No   Objective:   BP 126/86 (BP Location: Left Arm, Patient Position: Sitting, Cuff Size: Large)   Pulse 80   Temp 97.7 F (36.5 C) (Oral)   Resp 16   Wt (!) 351 lb (159.2 kg)   BMI 38.56 kg/m  Vitals:   05/22/18 1340  BP: 126/86  Pulse: 80  Resp: 16  Temp: 97.7 F (36.5 C)  TempSrc: Oral  Weight: (!) 351 lb (159.2 kg)     Physical Exam  Constitutional: He is oriented to person, place, and time. He appears well-developed and well-nourished.  HENT:  Head: Normocephalic and atraumatic.  Eyes: Conjunctivae are normal. No scleral icterus.  Neck: No thyromegaly present.  Cardiovascular: Normal rate, regular rhythm and normal heart sounds.  Pulmonary/Chest: Effort normal and breath sounds normal.  Abdominal: Soft.  Genitourinary: Rectum normal, prostate normal and penis normal.  Musculoskeletal: He exhibits no edema.  Neurological: He is alert and oriented to person, place, and time.  Skin: Skin is warm and dry. Capillary refill takes less than 2 seconds.  Psychiatric: He has a normal mood and affect. His behavior is normal. Judgment and thought content normal.        Assessment & Plan:     1. Benign prostatic hyperplasia with lower urinary tract symptoms, symptom details unspecified No  abx at this time. Tamulosin might be appropriate.  2. Class 2 obesity due to excess calories without serious comorbidity with body mass index (BMI) of 38.0 to 38.9 in adult  I have done the exam and reviewed the chart and it is accurate to the best of my knowledge. Development worker, community has been used and  any errors in dictation or transcription are unintentional. Miguel Aschoff M.D. [place], MD  [facility]

## 2018-06-01 DIAGNOSIS — J301 Allergic rhinitis due to pollen: Secondary | ICD-10-CM | POA: Diagnosis not present

## 2018-06-01 DIAGNOSIS — J3089 Other allergic rhinitis: Secondary | ICD-10-CM | POA: Diagnosis not present

## 2018-06-05 DIAGNOSIS — J3089 Other allergic rhinitis: Secondary | ICD-10-CM | POA: Diagnosis not present

## 2018-06-05 DIAGNOSIS — J301 Allergic rhinitis due to pollen: Secondary | ICD-10-CM | POA: Diagnosis not present

## 2018-06-12 DIAGNOSIS — J3089 Other allergic rhinitis: Secondary | ICD-10-CM | POA: Diagnosis not present

## 2018-06-12 DIAGNOSIS — J301 Allergic rhinitis due to pollen: Secondary | ICD-10-CM | POA: Diagnosis not present

## 2018-07-02 ENCOUNTER — Encounter: Payer: Self-pay | Admitting: Family Medicine

## 2018-07-02 ENCOUNTER — Ambulatory Visit (INDEPENDENT_AMBULATORY_CARE_PROVIDER_SITE_OTHER): Payer: BLUE CROSS/BLUE SHIELD | Admitting: Family Medicine

## 2018-07-02 VITALS — BP 122/82 | HR 70 | Temp 98.1°F | Resp 16 | Ht >= 80 in | Wt 358.0 lb

## 2018-07-02 DIAGNOSIS — Z0000 Encounter for general adult medical examination without abnormal findings: Secondary | ICD-10-CM | POA: Diagnosis not present

## 2018-07-02 DIAGNOSIS — Z6838 Body mass index (BMI) 38.0-38.9, adult: Secondary | ICD-10-CM | POA: Diagnosis not present

## 2018-07-02 DIAGNOSIS — N401 Enlarged prostate with lower urinary tract symptoms: Secondary | ICD-10-CM | POA: Diagnosis not present

## 2018-07-02 DIAGNOSIS — E6609 Other obesity due to excess calories: Secondary | ICD-10-CM

## 2018-07-02 NOTE — Progress Notes (Signed)
Patient: Andrew Andersen., Male    DOB: 1975-11-10, 42 y.o.   MRN: [id number] Visit Date: 07/02/2018  Today's Provider: Wilhemena Durie, MD   Chief Complaint  Patient presents with  . Annual Exam   Subjective:    Annual physical exam Andrew Andersen. is a 42 y.o. male who presents today for health maintenance and complete physical. He feels well. He reports exercising not regularly. He reports he is sleeping fairly well.  Td- 06/30/2017  Review of Systems  Constitutional: Negative.   HENT: Negative.   Eyes: Negative.   Respiratory: Negative.   Cardiovascular: Negative.   Gastrointestinal: Negative.   Endocrine: Negative.   Genitourinary: Negative.   Musculoskeletal: Negative.   Skin: Negative.   Allergic/Immunologic: Negative.   Neurological: Negative.   Hematological: Negative.   Psychiatric/Behavioral: Negative.     Social History      He  reports that he quit smoking about 5 years ago. He has a 10.00 pack-year smoking history. He quit smokeless tobacco use about 3 years ago.  His smokeless tobacco use included snuff. He reports that he does not drink alcohol or use drugs.       Social History   Socioeconomic History  . Marital status: Single    Spouse name: Not on file  . Number of children: Not on file  . Years of education: Not on file  . Highest education level: Not on file  Occupational History  . Not on file  Social Needs  . Financial resource strain: Not on file  . Food insecurity:    Worry: Not on file    Inability: Not on file  . Transportation needs:    Medical: Not on file    Non-medical: Not on file  Tobacco Use  . Smoking status: Former Smoker    Packs/day: 1.00    Years: 10.00    Pack years: 10.00    Last attempt to quit: 07/24/2012    Years since quitting: 5.9  . Smokeless tobacco: Former Systems developer    Types: Snuff    Quit date: 01/13/2015  Substance and Sexual Activity  . Alcohol use: No  . Drug use: No  . Sexual activity:  Not on file  Lifestyle  . Physical activity:    Days per week: Not on file    Minutes per session: Not on file  . Stress: Not on file  Relationships  . Social connections:    Talks on phone: Not on file    Gets together: Not on file    Attends religious service: Not on file    Active member of club or organization: Not on file    Attends meetings of clubs or organizations: Not on file    Relationship status: Not on file  Other Topics Concern  . Not on file  Social History Narrative  . Not on file    Past Medical History:  Diagnosis Date  . Allergy      Patient Active Problem List   Diagnosis Date Noted  . Allergic rhinitis 01/14/2015  . Anxiety, generalized 01/14/2015  . Cannot sleep 01/14/2015  . 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 Adiposity 01/14/2015    Past Surgical History:  Procedure Laterality Date  . KNEE SURGERY Left     Family History        Family Status  Relation Name Status  . Mother  Alive  . Father  Alive       pre  diabetes  . Sister  Alive  . MGM  Alive  . MGF  Deceased  . PGM  Deceased  . PGF  Deceased        His family history includes Alzheimer's disease in his paternal grandmother; Dementia in his paternal grandmother; Heart attack in his maternal grandfather; Heart disease in his maternal grandmother; Hypertension in his mother.      No Known Allergies   Current Outpatient Medications:  .  DYMISTA 137-50 MCG/ACT SUSP, , Disp: , Rfl: 2 .  levocetirizine (XYZAL) 5 MG tablet, Take 5 mg by mouth every evening., Disp: , Rfl: 3 .  montelukast (SINGULAIR) 10 MG tablet, Take 10 mg by mouth every evening., Disp: , Rfl: 3 .  ciprofloxacin (CIPRO) 500 MG tablet, Take 1 tablet (500 mg total) by mouth 2 (two) times daily. (Patient not taking: Reported on 07/02/2018), Disp: 14 tablet, Rfl: 0 .  metroNIDAZOLE (FLAGYL) 500 MG tablet, Take 1 tablet (500 mg total) by mouth 3 (three) times daily. (Patient not taking: Reported on 07/02/2018), Disp: 21 tablet,  Rfl: 0   Patient Care Team: Jerrol Banana., MD as PCP - General (Family Medicine)      Objective:   Vitals: BP 122/82 (BP Location: Left Arm, Patient Position: Sitting, Cuff Size: Large)   Pulse 70   Temp 98.1 F (36.7 C)   Resp 16   Ht 6\' 8"  (2.032 m)   Wt (!) 358 lb (162.4 kg)   SpO2 98%   BMI 39.33 kg/m    Vitals:   07/02/18 0918  BP: 122/82  Pulse: 70  Resp: 16  Temp: 98.1 F (36.7 C)  SpO2: 98%  Weight: (!) 358 lb (162.4 kg)  Height: 6\' 8"  (2.032 m)     Physical Exam Constitutional:      Appearance: Normal appearance. He is obese.  HENT:     Head: Normocephalic and atraumatic.     Right Ear: Tympanic membrane normal.     Left Ear: Tympanic membrane normal.     Nose: Nose normal.     Mouth/Throat:     Mouth: Mucous membranes are moist.     Pharynx: Oropharynx is clear.  Eyes:     General: No scleral icterus.    Conjunctiva/sclera: Conjunctivae normal.     Pupils: Pupils are equal, round, and reactive to light.  Neck:     Vascular: No carotid bruit.  Cardiovascular:     Rate and Rhythm: Normal rate and regular rhythm.     Pulses: Normal pulses.     Heart sounds: Normal heart sounds.  Pulmonary:     Effort: Pulmonary effort is normal.     Breath sounds: Normal breath sounds.  Abdominal:     Palpations: Abdomen is soft.  Musculoskeletal:        General: No swelling.  Lymphadenopathy:     Cervical: No cervical adenopathy.  Skin:    General: Skin is warm and dry.  Neurological:     General: No focal deficit present.     Mental Status: He is alert and oriented to person, place, and time.  Psychiatric:        Mood and Affect: Mood normal.        Behavior: Behavior normal.        Thought Content: Thought content normal.        Judgment: Judgment normal.      Depression Screen PHQ 2/9 Scores 07/02/2018 06/30/2017 07/01/2015  PHQ - 2 Score 0  0 0      Assessment & Plan:     Routine Health Maintenance and Physical Exam  Exercise  Activities and Dietary recommendations Goals   None     Immunization History  Administered Date(s) Administered  . Td 06/30/2017  . Tdap 01/10/2007    Health Maintenance  Topic Date Due  . HIV Screening  06/09/1992  . INFLUENZA VACCINE  10/24/2018 (Originally 02/22/2018)  . TETANUS/TDAP  07/01/2027     Discussed health benefits of physical activity, and encouraged him to engage in regular exercise appropriate for his age and condition.  1. Annual physical exam  - CBC with Differential/Platelet - Comprehensive metabolic panel - Lipid panel - TSH  2. Benign prostatic hyperplasia with lower urinary tract symptoms, symptom details unspecified  - PSA  3. Class 2 obesity due to excess calories without serious comorbidity with body mass index (BMI) of 38.0 to 38.9 in adult Diet and exercise discussed at length.  Like to see the patient at least get below 300 pounds and see how he feels.  Goal waist size of 36 to 38 inches.  I have done the exam and reviewed the above chart and it is accurate to the best of my knowledge. Development worker, community has been used in this note in any air is in the dictation or transcription are unintentional.  Wilhemena Durie, MD  [place]

## 2018-07-03 LAB — CBC WITH DIFFERENTIAL/PLATELET
BASOS: 1 %
Basophils Absolute: 0.1 10*3/uL (ref 0.0–0.2)
EOS (ABSOLUTE): 0.2 10*3/uL (ref 0.0–0.4)
EOS: 4 %
HEMATOCRIT: 42.9 % (ref 37.5–51.0)
Hemoglobin: 14 g/dL (ref 13.0–17.7)
IMMATURE GRANS (ABS): 0 10*3/uL (ref 0.0–0.1)
IMMATURE GRANULOCYTES: 1 %
LYMPHS: 29 %
Lymphocytes Absolute: 1.2 10*3/uL (ref 0.7–3.1)
MCH: 28.5 pg (ref 26.6–33.0)
MCHC: 32.6 g/dL (ref 31.5–35.7)
MCV: 87 fL (ref 79–97)
Monocytes Absolute: 0.4 10*3/uL (ref 0.1–0.9)
Monocytes: 10 %
NEUTROS PCT: 55 %
Neutrophils Absolute: 2.3 10*3/uL (ref 1.4–7.0)
PLATELETS: 189 10*3/uL (ref 150–450)
RBC: 4.91 x10E6/uL (ref 4.14–5.80)
RDW: 13.4 % (ref 12.3–15.4)
WBC: 4.1 10*3/uL (ref 3.4–10.8)

## 2018-07-03 LAB — COMPREHENSIVE METABOLIC PANEL
A/G RATIO: 2.6 — AB (ref 1.2–2.2)
ALT: 31 IU/L (ref 0–44)
AST: 21 IU/L (ref 0–40)
Albumin: 4.6 g/dL (ref 3.5–5.5)
Alkaline Phosphatase: 64 IU/L (ref 39–117)
BUN/Creatinine Ratio: 9 (ref 9–20)
BUN: 10 mg/dL (ref 6–24)
Bilirubin Total: 0.8 mg/dL (ref 0.0–1.2)
CO2: 23 mmol/L (ref 20–29)
Calcium: 9.5 mg/dL (ref 8.7–10.2)
Chloride: 102 mmol/L (ref 96–106)
Creatinine, Ser: 1.08 mg/dL (ref 0.76–1.27)
GFR calc Af Amer: 98 mL/min/{1.73_m2} (ref >59)
GFR, EST NON AFRICAN AMERICAN: 85 mL/min/{1.73_m2} (ref >59)
Globulin, Total: 1.8 g/dL (ref 1.5–4.5)
Glucose: 96 mg/dL (ref 65–99)
POTASSIUM: 3.9 mmol/L (ref 3.5–5.2)
Sodium: 140 mmol/L (ref 134–144)
Total Protein: 6.4 g/dL (ref 6.0–8.5)

## 2018-07-03 LAB — TSH: TSH: 2.72 u[IU]/mL (ref 0.450–4.500)

## 2018-07-03 LAB — LIPID PANEL
Chol/HDL Ratio: 4.9 ratio (ref 0.0–5.0)
Cholesterol, Total: 209 mg/dL — ABNORMAL HIGH (ref 100–199)
HDL: 43 mg/dL (ref >39)
LDL CALC: 117 mg/dL — AB (ref 0–99)
Triglycerides: 245 mg/dL — ABNORMAL HIGH (ref 0–149)
VLDL CHOLESTEROL CAL: 49 mg/dL — AB (ref 5–40)

## 2018-07-03 LAB — PSA: Prostate Specific Ag, Serum: 0.5 ng/mL (ref 0.0–4.0)

## 2018-07-06 ENCOUNTER — Telehealth: Payer: Self-pay

## 2018-07-06 DIAGNOSIS — J301 Allergic rhinitis due to pollen: Secondary | ICD-10-CM | POA: Diagnosis not present

## 2018-07-06 DIAGNOSIS — J3089 Other allergic rhinitis: Secondary | ICD-10-CM | POA: Diagnosis not present

## 2018-07-06 NOTE — Telephone Encounter (Signed)
Pt advised.   Thanks,   -Stephaney Steven  

## 2018-07-06 NOTE — Telephone Encounter (Signed)
-----   Message from Jerrol Banana., MD sent at 07/06/2018  8:33 AM EST ----- Labs stable.  Work on lifestyle as discussed.

## 2018-07-19 DIAGNOSIS — J3089 Other allergic rhinitis: Secondary | ICD-10-CM | POA: Diagnosis not present

## 2018-07-19 DIAGNOSIS — J301 Allergic rhinitis due to pollen: Secondary | ICD-10-CM | POA: Diagnosis not present

## 2018-07-26 DIAGNOSIS — J3089 Other allergic rhinitis: Secondary | ICD-10-CM | POA: Diagnosis not present

## 2018-07-26 DIAGNOSIS — J301 Allergic rhinitis due to pollen: Secondary | ICD-10-CM | POA: Diagnosis not present

## 2018-07-31 DIAGNOSIS — J3089 Other allergic rhinitis: Secondary | ICD-10-CM | POA: Diagnosis not present

## 2018-07-31 DIAGNOSIS — J301 Allergic rhinitis due to pollen: Secondary | ICD-10-CM | POA: Diagnosis not present

## 2018-08-08 ENCOUNTER — Ambulatory Visit: Payer: BLUE CROSS/BLUE SHIELD | Admitting: Physician Assistant

## 2018-08-08 ENCOUNTER — Encounter: Payer: Self-pay | Admitting: Physician Assistant

## 2018-08-08 VITALS — BP 121/80 | HR 71 | Temp 97.8°F | Resp 16 | Wt 365.0 lb

## 2018-08-08 DIAGNOSIS — L729 Follicular cyst of the skin and subcutaneous tissue, unspecified: Secondary | ICD-10-CM | POA: Diagnosis not present

## 2018-08-08 DIAGNOSIS — L089 Local infection of the skin and subcutaneous tissue, unspecified: Secondary | ICD-10-CM | POA: Diagnosis not present

## 2018-08-08 MED ORDER — AMOXICILLIN-POT CLAVULANATE 875-125 MG PO TABS: 1.0000 | ORAL_TABLET | Freq: Two times a day (BID) | ORAL | 0 refills | Status: AC

## 2018-08-08 NOTE — Patient Instructions (Signed)

## 2018-08-08 NOTE — Progress Notes (Signed)
       Patient: Andrew Andersen. Male    DOB: July 30, 1975   43 y.o.   MRN: [id number] Visit Date: 08/08/2018  Today's Provider: Trinna Post, PA-C   Chief Complaint  Patient presents with  . Cyst   Subjective:     HPI Patient here today c/o cyst on right side x's 5 days. Patient reports that cyst ruptured today. Patient drainage was red. Patient has a history of cysts that have been removed prior by Dr. Evorn Gong at dermatology. He has an appointment with Dr. Evorn Gong this Friday but wanted to get it checked sooner. Denies fever, nausea, vomiting.   No Known Allergies   Current Outpatient Medications:  .  levocetirizine (XYZAL) 5 MG tablet, Take 5 mg by mouth every evening., Disp: , Rfl: 3 .  montelukast (SINGULAIR) 10 MG tablet, Take 10 mg by mouth every evening., Disp: , Rfl: 3  Review of Systems  Skin: Positive for rash and wound.    Social History   Tobacco Use  . Smoking status: Former Smoker    Packs/day: 1.00    Years: 10.00    Pack years: 10.00    Last attempt to quit: 07/24/2012    Years since quitting: 6.0  . Smokeless tobacco: Former Systems developer    Types: Snuff    Quit date: 01/13/2015  Substance Use Topics  . Alcohol use: No      Objective:   BP 121/80 (BP Location: Left Arm, Patient Position: Sitting, Cuff Size: Normal)   Pulse 71   Temp 97.8 F (36.6 C) (Oral)   Resp 16   Wt (!) 365 lb (165.6 kg)   BMI 40.10 kg/m  Vitals:   08/08/18 1530  BP: 121/80  Pulse: 71  Resp: 16  Temp: 97.8 F (36.6 C)  TempSrc: Oral  Weight: (!) 365 lb (165.6 kg)     Physical Exam Constitutional:      Appearance: Normal appearance.  Skin:    General: Skin is warm and dry.       Neurological:     Mental Status: He is alert. Mental status is at baseline.  Psychiatric:        Mood and Affect: Mood normal.        Behavior: Behavior normal.         Assessment & Plan    1. Infected cyst of skin  Some fluid able to be expressed. Counseled patient that it  is supposed to drain and as long as he does this he shouldn't need antibiotic. He will likely need cyst removed however and he can accomplish this at his Friday appointment with Dr. Evorn Gong. Abx given as precaution should he experience worsening redness, pain, drainage.   - amoxicillin-clavulanate (AUGMENTIN) 875-125 MG tablet; Take 1 tablet by mouth 2 (two) times daily for 7 days.  Dispense: 14 tablet; Refill: 0  The entirety of the information documented in the History of Present Illness, Review of Systems and Physical Exam were personally obtained by me. Portions of this information were initially documented by Lynford Humphrey, CMA and reviewed by me for thoroughness and accuracy.   Return if symptoms worsen or fail to improve.      Trinna Post, PA-C  [facility]

## 2018-08-10 DIAGNOSIS — L720 Epidermal cyst: Secondary | ICD-10-CM | POA: Diagnosis not present

## 2018-08-16 DIAGNOSIS — J301 Allergic rhinitis due to pollen: Secondary | ICD-10-CM | POA: Diagnosis not present

## 2018-08-16 DIAGNOSIS — J3089 Other allergic rhinitis: Secondary | ICD-10-CM | POA: Diagnosis not present

## 2018-08-23 DIAGNOSIS — J301 Allergic rhinitis due to pollen: Secondary | ICD-10-CM | POA: Diagnosis not present

## 2018-08-23 DIAGNOSIS — J3089 Other allergic rhinitis: Secondary | ICD-10-CM | POA: Diagnosis not present

## 2018-08-28 DIAGNOSIS — J301 Allergic rhinitis due to pollen: Secondary | ICD-10-CM | POA: Diagnosis not present

## 2018-08-28 DIAGNOSIS — J3089 Other allergic rhinitis: Secondary | ICD-10-CM | POA: Diagnosis not present

## 2018-08-31 DIAGNOSIS — J301 Allergic rhinitis due to pollen: Secondary | ICD-10-CM | POA: Diagnosis not present

## 2018-09-03 DIAGNOSIS — J3089 Other allergic rhinitis: Secondary | ICD-10-CM | POA: Diagnosis not present

## 2018-09-04 DIAGNOSIS — J301 Allergic rhinitis due to pollen: Secondary | ICD-10-CM | POA: Diagnosis not present

## 2018-09-04 DIAGNOSIS — J3089 Other allergic rhinitis: Secondary | ICD-10-CM | POA: Diagnosis not present

## 2018-09-11 DIAGNOSIS — J3089 Other allergic rhinitis: Secondary | ICD-10-CM | POA: Diagnosis not present

## 2018-09-11 DIAGNOSIS — J301 Allergic rhinitis due to pollen: Secondary | ICD-10-CM | POA: Diagnosis not present

## 2018-09-23 ENCOUNTER — Emergency Department
Admission: EM | Admit: 2018-09-23 | Discharge: 2018-09-23 | Disposition: A | Discharge status: Home / Self Care | Payer: BLUE CROSS/BLUE SHIELD | Attending: Emergency Medicine | Admitting: Emergency Medicine

## 2018-09-23 ENCOUNTER — Encounter: Payer: Self-pay | Admitting: Emergency Medicine

## 2018-09-23 ENCOUNTER — Other Ambulatory Visit: Payer: Self-pay

## 2018-09-23 DIAGNOSIS — Y9289 Other specified places as the place of occurrence of the external cause: Secondary | ICD-10-CM | POA: Diagnosis not present

## 2018-09-23 DIAGNOSIS — S6992XA Unspecified injury of left wrist, hand and finger(s), initial encounter: Secondary | ICD-10-CM | POA: Diagnosis present

## 2018-09-23 DIAGNOSIS — S61012A Laceration without foreign body of left thumb without damage to nail, initial encounter: Secondary | ICD-10-CM | POA: Insufficient documentation

## 2018-09-23 DIAGNOSIS — Y9389 Activity, other specified: Secondary | ICD-10-CM | POA: Diagnosis not present

## 2018-09-23 DIAGNOSIS — Y998 Other external cause status: Secondary | ICD-10-CM | POA: Diagnosis not present

## 2018-09-23 DIAGNOSIS — Z23 Encounter for immunization: Secondary | ICD-10-CM | POA: Diagnosis not present

## 2018-09-23 DIAGNOSIS — T148XXA Other injury of unspecified body region, initial encounter: Secondary | ICD-10-CM

## 2018-09-23 DIAGNOSIS — Z87891 Personal history of nicotine dependence: Secondary | ICD-10-CM | POA: Diagnosis not present

## 2018-09-23 DIAGNOSIS — X19XXXA Contact with other heat and hot substances, initial encounter: Secondary | ICD-10-CM | POA: Insufficient documentation

## 2018-09-23 MED ORDER — TETANUS-DIPHTH-ACELL PERTUSSIS 5-2.5-18.5 LF-MCG/0.5 IM SUSP
0.5000 mL | Freq: Once | INTRAMUSCULAR | Status: AC
  Administered 2018-09-23: 0.5 mL via INTRAMUSCULAR
  Filled 2018-09-23: qty 0.5

## 2018-09-23 MED ORDER — CEPHALEXIN 500 MG PO CAPS: 500.0000 mg | ORAL_CAPSULE | Freq: Three times a day (TID) | ORAL | 0 refills | Status: AC
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
## 2018-09-26 ENCOUNTER — Encounter: Payer: Self-pay | Admitting: Family Medicine

## 2018-09-26 ENCOUNTER — Ambulatory Visit: Payer: BLUE CROSS/BLUE SHIELD | Admitting: Family Medicine

## 2018-09-26 VITALS — BP 146/85 | HR 66 | Temp 98.2°F | Resp 16 | Wt 354.0 lb

## 2018-09-26 DIAGNOSIS — T148XXA Other injury of unspecified body region, initial encounter: Secondary | ICD-10-CM | POA: Diagnosis not present

## 2018-09-26 DIAGNOSIS — T300 Burn of unspecified body region, unspecified degree: Secondary | ICD-10-CM | POA: Diagnosis not present

## 2018-09-26 NOTE — Patient Instructions (Signed)
Keep area clean with soap and water.  Use topical Vitamin E ointment twice daily.
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
## 2018-09-27 DIAGNOSIS — J301 Allergic rhinitis due to pollen: Secondary | ICD-10-CM | POA: Diagnosis not present

## 2018-09-27 DIAGNOSIS — J3089 Other allergic rhinitis: Secondary | ICD-10-CM | POA: Diagnosis not present

## 2018-10-03 ENCOUNTER — Encounter: Payer: Self-pay | Admitting: Physician Assistant

## 2018-10-03 ENCOUNTER — Ambulatory Visit: Payer: BLUE CROSS/BLUE SHIELD | Admitting: Physician Assistant

## 2018-10-03 ENCOUNTER — Other Ambulatory Visit: Payer: Self-pay

## 2018-10-03 VITALS — BP 117/71 | HR 71 | Temp 98.2°F | Resp 16 | Wt 368.0 lb

## 2018-10-03 DIAGNOSIS — T300 Burn of unspecified body region, unspecified degree: Secondary | ICD-10-CM
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
## 2018-10-03 NOTE — Patient Instructions (Signed)
Burn Care, Adult  A burn is an injury to the skin or the tissues under the skin. There are three types of burns:  · First degree. These burns may cause the skin to be red and a bit swollen.  · Second degree. These burns are very painful and cause the skin to be very red. The skin may also leak fluid, look shiny, and start to have blisters.  · Third degree. These burns cause permanent damage. They turn the skin white or black and make it look charred, dry, and leathery.  Taking care of your burn properly can help to prevent pain and infection. It can also help the burn to heal more quickly.  How is this treated?  Right after a burn:  · Rinse or soak the burn under cool water. Do this for several minutes. Do not put ice on your burn. That can cause more damage.  · Lightly cover the burn with a clean (sterile) cloth (dressing).  Burn care  · Raise (elevate) the injured area above the level of your heart while sitting or lying down.  · Follow instructions from your doctor about:  ? How to clean and take care of the burn.  ? When to change and remove the cloth.  · Check your burn every day for signs of infection. Check for:  ? More redness, swelling, or pain.  ? Warmth.  ? Pus or a bad smell.  Medicine    · Take over-the-counter and prescription medicines only as told by your doctor.  · If you were prescribed antibiotic medicine, take or apply it as told by your doctor. Do not stop using the antibiotic even if your condition improves.  General instructions  · To prevent infection:  ? Do not put butter, oil, or other home treatments on the burn.  ? Do not scratch or pick at the burn.  ? Do not break any blisters.  ? Do not peel skin.  · Do not rub your burn, even when you are cleaning it.  · Protect your burn from the sun.  Contact a doctor if:  · Your condition does not get better.  · Your condition gets worse.  · You have a fever.  · Your burn looks different or starts to have black or red spots on it.  · Your burn  feels warm to the touch.  · Your pain is not controlled with medicine.  Get help right away if:  · You have redness, swelling, or pain at the site of the burn.  · You have fluid, blood, or pus coming from your burn.  · You have red streaks near the burn.  · You have very bad pain.  This information is not intended to replace advice given to you by your health care provider. Make sure you discuss any questions you have with your health care provider.  Document Released: 04/19/2008 Document Revised: 02/21/2017 Document Reviewed: 12/29/2015  Elsevier Interactive Patient Education © 2019 [employer]vier Inc[employer]

## 2018-10-04 DIAGNOSIS — J3089 Other allergic rhinitis: Secondary | ICD-10-CM | POA: Diagnosis not present

## 2018-10-04 DIAGNOSIS — J301 Allergic rhinitis due to pollen: Secondary | ICD-10-CM | POA: Diagnosis not present

## 2018-10-11 DIAGNOSIS — J3089 Other allergic rhinitis: Secondary | ICD-10-CM | POA: Diagnosis not present

## 2018-10-11 DIAGNOSIS — J301 Allergic rhinitis due to pollen: Secondary | ICD-10-CM | POA: Diagnosis not present

## 2018-10-18 DIAGNOSIS — J3089 Other allergic rhinitis: Secondary | ICD-10-CM | POA: Diagnosis not present

## 2018-10-18 DIAGNOSIS — J301 Allergic rhinitis due to pollen: Secondary | ICD-10-CM | POA: Diagnosis not present

## 2018-10-23 DIAGNOSIS — J3089 Other allergic rhinitis: Secondary | ICD-10-CM | POA: Diagnosis not present

## 2018-10-23 DIAGNOSIS — J301 Allergic rhinitis due to pollen: Secondary | ICD-10-CM | POA: Diagnosis not present

## 2018-10-26 DIAGNOSIS — J301 Allergic rhinitis due to pollen: Secondary | ICD-10-CM | POA: Diagnosis not present

## 2018-10-26 DIAGNOSIS — J3089 Other allergic rhinitis: Secondary | ICD-10-CM | POA: Diagnosis not present

## 2018-10-30 DIAGNOSIS — J301 Allergic rhinitis due to pollen: Secondary | ICD-10-CM | POA: Diagnosis not present

## 2018-10-30 DIAGNOSIS — J3089 Other allergic rhinitis: Secondary | ICD-10-CM | POA: Diagnosis not present

## 2018-11-02 DIAGNOSIS — S8392XA Sprain of unspecified site of left knee, initial encounter: Secondary | ICD-10-CM | POA: Diagnosis not present

## 2018-11-06 DIAGNOSIS — J3089 Other allergic rhinitis: Secondary | ICD-10-CM | POA: Diagnosis not present

## 2018-11-06 DIAGNOSIS — J301 Allergic rhinitis due to pollen: Secondary | ICD-10-CM | POA: Diagnosis not present

## 2018-11-27 DIAGNOSIS — J301 Allergic rhinitis due to pollen: Secondary | ICD-10-CM | POA: Diagnosis not present

## 2018-11-27 DIAGNOSIS — J3089 Other allergic rhinitis: Secondary | ICD-10-CM | POA: Diagnosis not present

## 2018-12-06 DIAGNOSIS — J3081 Allergic rhinitis due to animal (cat) (dog) hair and dander: Secondary | ICD-10-CM | POA: Diagnosis not present

## 2018-12-06 DIAGNOSIS — J3089 Other allergic rhinitis: Secondary | ICD-10-CM | POA: Diagnosis not present

## 2018-12-06 DIAGNOSIS — J301 Allergic rhinitis due to pollen: Secondary | ICD-10-CM | POA: Diagnosis not present

## 2018-12-20 DIAGNOSIS — J301 Allergic rhinitis due to pollen: Secondary | ICD-10-CM | POA: Diagnosis not present

## 2018-12-20 DIAGNOSIS — J3089 Other allergic rhinitis: Secondary | ICD-10-CM | POA: Diagnosis not present

## 2019-01-03 DIAGNOSIS — J3089 Other allergic rhinitis: Secondary | ICD-10-CM | POA: Diagnosis not present

## 2019-01-03 DIAGNOSIS — J3081 Allergic rhinitis due to animal (cat) (dog) hair and dander: Secondary | ICD-10-CM | POA: Diagnosis not present

## 2019-01-03 DIAGNOSIS — J301 Allergic rhinitis due to pollen: Secondary | ICD-10-CM | POA: Diagnosis not present

## 2019-01-08 DIAGNOSIS — J301 Allergic rhinitis due to pollen: Secondary | ICD-10-CM | POA: Diagnosis not present

## 2019-01-08 DIAGNOSIS — J3089 Other allergic rhinitis: Secondary | ICD-10-CM | POA: Diagnosis not present

## 2019-01-08 DIAGNOSIS — J3081 Allergic rhinitis due to animal (cat) (dog) hair and dander: Secondary | ICD-10-CM | POA: Diagnosis not present

## 2019-01-15 DIAGNOSIS — J301 Allergic rhinitis due to pollen: Secondary | ICD-10-CM | POA: Diagnosis not present

## 2019-01-15 DIAGNOSIS — J3089 Other allergic rhinitis: Secondary | ICD-10-CM | POA: Diagnosis not present

## 2019-01-22 DIAGNOSIS — J3089 Other allergic rhinitis: Secondary | ICD-10-CM | POA: Diagnosis not present

## 2019-01-22 DIAGNOSIS — J301 Allergic rhinitis due to pollen: Secondary | ICD-10-CM | POA: Diagnosis not present

## 2019-01-23 IMAGING — CR DG ANKLE 2V *R*
1 series · 2 of 2 positions shown · non-contrast
Comparison: None.

CLINICAL DATA: Right ankle pain for 3-4 days.

EXAM:
RIGHT ANKLE - 2 VIEW

[Series 1: dg ankle 2 views right · 0.14mm/px · 2 of 2 slices shown]
[im 1/2]
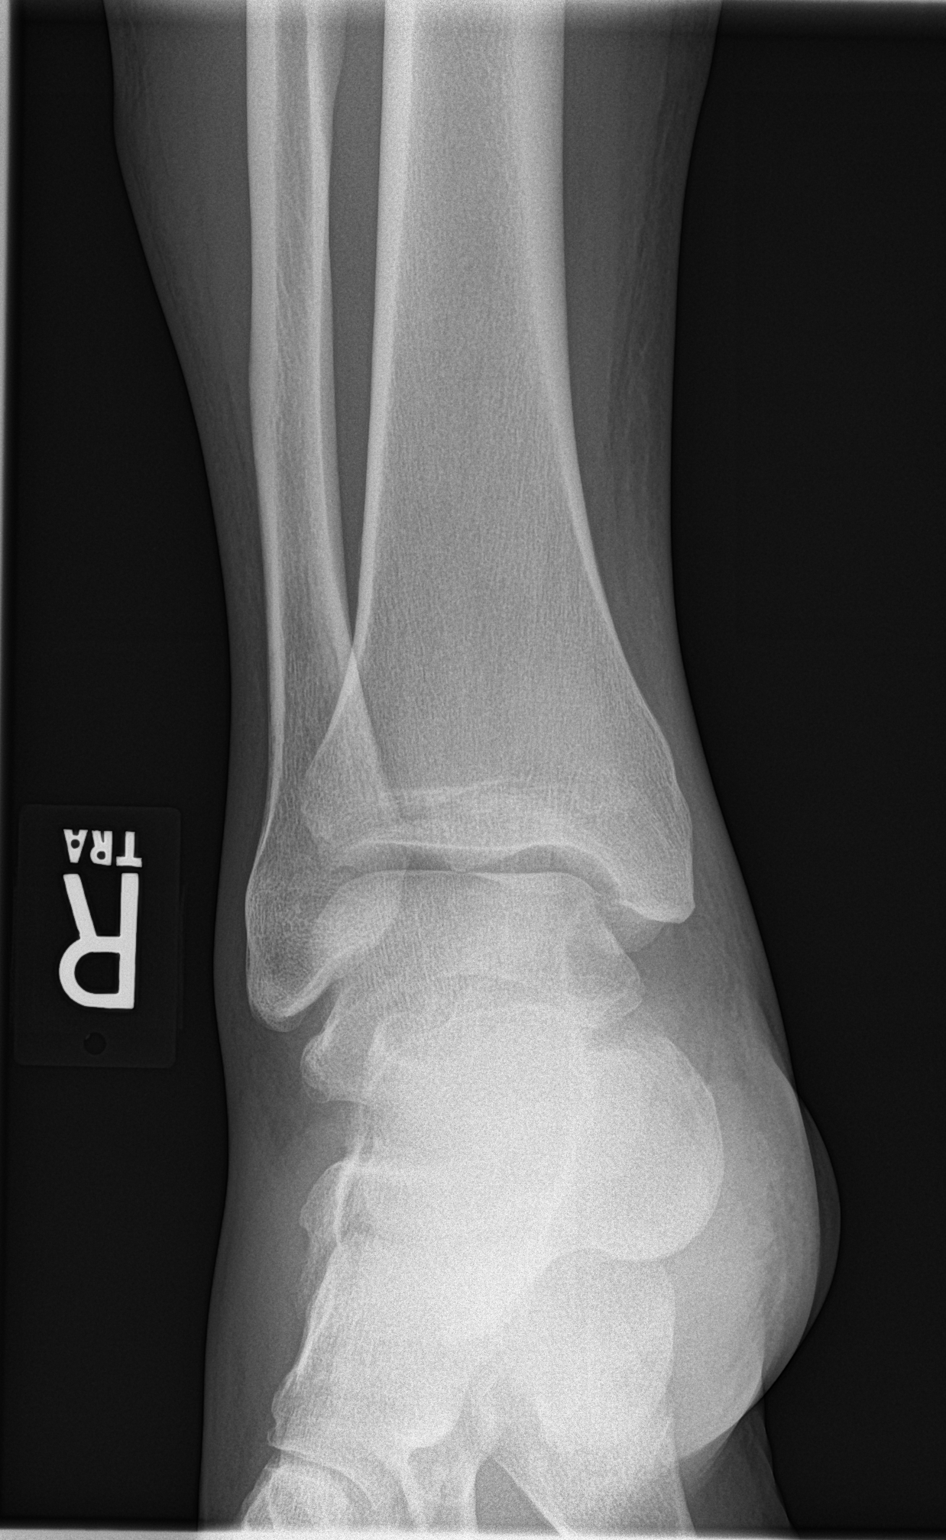
[im 2/2]
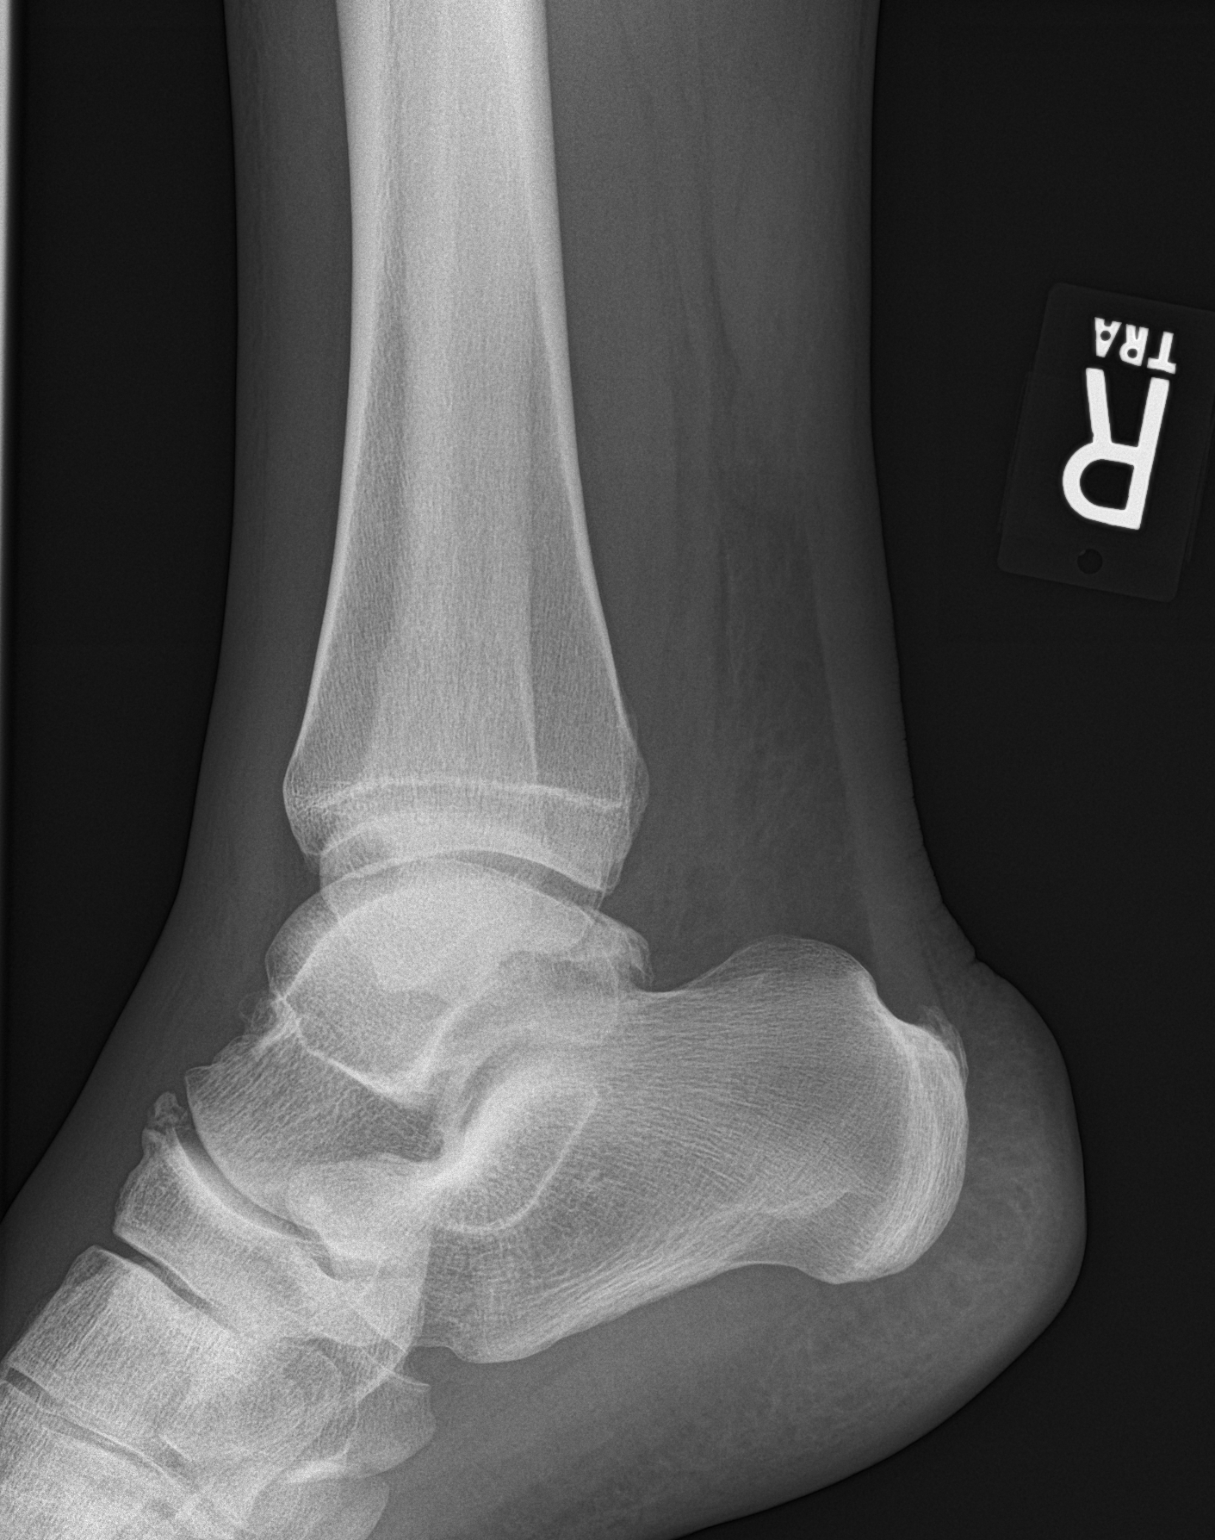

[2 of 2 positions shown; findings below may reference images not displayed]

FINDINGS: There is no evidence of fracture, dislocation, or joint effusion.
Mild osteoarthritis of the talonavicular joint. Soft tissues are
unremarkable.
IMPRESSION: No acute osseous injury of the right ankle.

## 2019-01-31 DIAGNOSIS — J301 Allergic rhinitis due to pollen: Secondary | ICD-10-CM | POA: Diagnosis not present

## 2019-01-31 DIAGNOSIS — J3089 Other allergic rhinitis: Secondary | ICD-10-CM | POA: Diagnosis not present

## 2019-02-08 DIAGNOSIS — J301 Allergic rhinitis due to pollen: Secondary | ICD-10-CM | POA: Diagnosis not present

## 2019-02-08 DIAGNOSIS — J3089 Other allergic rhinitis: Secondary | ICD-10-CM | POA: Diagnosis not present

## 2019-02-19 DIAGNOSIS — J301 Allergic rhinitis due to pollen: Secondary | ICD-10-CM | POA: Diagnosis not present

## 2019-02-19 DIAGNOSIS — J3089 Other allergic rhinitis: Secondary | ICD-10-CM | POA: Diagnosis not present

## 2019-03-07 DIAGNOSIS — J301 Allergic rhinitis due to pollen: Secondary | ICD-10-CM | POA: Diagnosis not present

## 2019-03-07 DIAGNOSIS — J3089 Other allergic rhinitis: Secondary | ICD-10-CM | POA: Diagnosis not present

## 2019-03-14 DIAGNOSIS — J3089 Other allergic rhinitis: Secondary | ICD-10-CM | POA: Diagnosis not present

## 2019-03-14 DIAGNOSIS — J301 Allergic rhinitis due to pollen: Secondary | ICD-10-CM | POA: Diagnosis not present

## 2019-03-15 DIAGNOSIS — J3089 Other allergic rhinitis: Secondary | ICD-10-CM | POA: Diagnosis not present

## 2019-03-22 DIAGNOSIS — J3089 Other allergic rhinitis: Secondary | ICD-10-CM | POA: Diagnosis not present

## 2019-03-22 DIAGNOSIS — J301 Allergic rhinitis due to pollen: Secondary | ICD-10-CM | POA: Diagnosis not present

## 2019-04-02 DIAGNOSIS — J3089 Other allergic rhinitis: Secondary | ICD-10-CM | POA: Diagnosis not present

## 2019-04-02 DIAGNOSIS — J301 Allergic rhinitis due to pollen: Secondary | ICD-10-CM | POA: Diagnosis not present

## 2019-04-08 DIAGNOSIS — D2272 Melanocytic nevi of left lower limb, including hip: Secondary | ICD-10-CM | POA: Diagnosis not present

## 2019-04-08 DIAGNOSIS — D2261 Melanocytic nevi of right upper limb, including shoulder: Secondary | ICD-10-CM | POA: Diagnosis not present

## 2019-04-08 DIAGNOSIS — D225 Melanocytic nevi of trunk: Secondary | ICD-10-CM | POA: Diagnosis not present

## 2019-04-08 DIAGNOSIS — D2262 Melanocytic nevi of left upper limb, including shoulder: Secondary | ICD-10-CM | POA: Diagnosis not present

## 2019-04-11 DIAGNOSIS — J301 Allergic rhinitis due to pollen: Secondary | ICD-10-CM | POA: Diagnosis not present

## 2019-04-11 DIAGNOSIS — J3089 Other allergic rhinitis: Secondary | ICD-10-CM | POA: Diagnosis not present

## 2019-04-16 DIAGNOSIS — J3089 Other allergic rhinitis: Secondary | ICD-10-CM | POA: Diagnosis not present

## 2019-04-16 DIAGNOSIS — J301 Allergic rhinitis due to pollen: Secondary | ICD-10-CM | POA: Diagnosis not present

## 2019-05-02 DIAGNOSIS — J301 Allergic rhinitis due to pollen: Secondary | ICD-10-CM | POA: Diagnosis not present

## 2019-05-02 DIAGNOSIS — J3089 Other allergic rhinitis: Secondary | ICD-10-CM | POA: Diagnosis not present

## 2019-05-07 DIAGNOSIS — J3089 Other allergic rhinitis: Secondary | ICD-10-CM | POA: Diagnosis not present

## 2019-05-07 DIAGNOSIS — J301 Allergic rhinitis due to pollen: Secondary | ICD-10-CM | POA: Diagnosis not present

## 2019-05-14 DIAGNOSIS — J3089 Other allergic rhinitis: Secondary | ICD-10-CM | POA: Diagnosis not present

## 2019-05-14 DIAGNOSIS — J301 Allergic rhinitis due to pollen: Secondary | ICD-10-CM | POA: Diagnosis not present

## 2019-05-21 DIAGNOSIS — J3089 Other allergic rhinitis: Secondary | ICD-10-CM | POA: Diagnosis not present

## 2019-05-21 DIAGNOSIS — J301 Allergic rhinitis due to pollen: Secondary | ICD-10-CM | POA: Diagnosis not present

## 2019-05-28 DIAGNOSIS — J301 Allergic rhinitis due to pollen: Secondary | ICD-10-CM | POA: Diagnosis not present

## 2019-05-28 DIAGNOSIS — J3089 Other allergic rhinitis: Secondary | ICD-10-CM | POA: Diagnosis not present

## 2019-06-04 DIAGNOSIS — J3089 Other allergic rhinitis: Secondary | ICD-10-CM | POA: Diagnosis not present

## 2019-06-04 DIAGNOSIS — J301 Allergic rhinitis due to pollen: Secondary | ICD-10-CM | POA: Diagnosis not present

## 2019-06-14 DIAGNOSIS — J301 Allergic rhinitis due to pollen: Secondary | ICD-10-CM | POA: Diagnosis not present

## 2019-06-14 DIAGNOSIS — J3089 Other allergic rhinitis: Secondary | ICD-10-CM | POA: Diagnosis not present

## 2019-06-27 DIAGNOSIS — J301 Allergic rhinitis due to pollen: Secondary | ICD-10-CM | POA: Diagnosis not present

## 2019-06-27 DIAGNOSIS — J3089 Other allergic rhinitis: Secondary | ICD-10-CM | POA: Diagnosis not present

## 2019-06-28 NOTE — Progress Notes (Signed)
Patient: Andrew Chaput., Male    DOB: 08-Jan-1976, 43 y.o.   MRN: [id number] Visit Date: 07/04/2019  Today's Provider: Wilhemena Durie, MD   Chief Complaint  Patient presents with  . Annual Exam   Subjective:     Annual physical exam Andrew Andersen. is a 43 y.o. male who presents today for health maintenance and complete physical. He feels well. He reports exercising walking. He reports he is sleeping fairly well.  -----------------------------------------------------------   Review of Systems  Constitutional: Negative.   HENT: Negative.   Eyes: Negative.   Respiratory: Negative.   Cardiovascular: Negative.   Gastrointestinal: Negative.   Endocrine: Negative.   Genitourinary: Negative.   Musculoskeletal: Negative.   Skin: Negative.   Allergic/Immunologic: Negative.   Neurological: Negative.   Hematological: Negative.   Psychiatric/Behavioral: Negative.     Social History      He  reports that he quit smoking about 6 years ago. He has a 10.00 pack-year smoking history. He quit smokeless tobacco use about 4 years ago.  His smokeless tobacco use included snuff. He reports that he does not drink alcohol or use drugs.       Social History   Socioeconomic History  . Marital status: Single    Spouse name: Not on file  . Number of children: Not on file  . Years of education: Not on file  . Highest education level: Not on file  Occupational History  . Not on file  Tobacco Use  . Smoking status: Former Smoker    Packs/day: 1.00    Years: 10.00    Pack years: 10.00    Quit date: 07/24/2012    Years since quitting: 6.9  . Smokeless tobacco: Former Systems developer    Types: Snuff    Quit date: 01/13/2015  Substance and Sexual Activity  . Alcohol use: No  . Drug use: No  . Sexual activity: Not on file  Other Topics Concern  . Not on file  Social History Narrative  . Not on file   Social Determinants of Health   Financial Resource Strain:   . Difficulty  of Paying Living Expenses: Not on file  Food Insecurity:   . Worried About Charity fundraiser in the Last Year: Not on file  . Ran Out of Food in the Last Year: Not on file  Transportation Needs:   . Lack of Transportation (Medical): Not on file  . Lack of Transportation (Non-Medical): Not on file  Physical Activity:   . Days of Exercise per Week: Not on file  . Minutes of Exercise per Session: Not on file  Stress:   . Feeling of Stress : Not on file  Social Connections:   . Frequency of Communication with Friends and Family: Not on file  . Frequency of Social Gatherings with Friends and Family: Not on file  . Attends Religious Services: Not on file  . Active Member of Clubs or Organizations: Not on file  . Attends Archivist Meetings: Not on file  . Marital Status: Not on file    Past Medical History:  Diagnosis Date  . Allergy      Patient Active Problem List   Diagnosis Date Noted  . Allergic rhinitis 01/14/2015  . Anxiety, generalized 01/14/2015  . Cannot sleep 01/14/2015  . 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 Adiposity 01/14/2015    Past Surgical History:  Procedure Laterality Date  . KNEE SURGERY Left  Family History        Family Status  Relation Name Status  . Mother  Alive  . Father  Alive       pre diabetes  . Sister  Alive  . MGM  Alive  . MGF  Deceased  . PGM  Deceased  . PGF  Deceased        His family history includes Alzheimer's disease in his paternal grandmother; Dementia in his paternal grandmother; Heart attack in his maternal grandfather; Heart disease in his maternal grandmother; Hypertension in his mother.      No Known Allergies   Current Outpatient Medications:  .  azelastine (ASTELIN) 0.1 % nasal spray, azelastine 137 mcg (0.1 %) nasal spray aerosol, Disp: , Rfl:  .  EPINEPHrine 0.3 mg/0.3 mL IJ SOAJ injection, epinephrine 0.3 mg/0.3 mL injection, auto-injector, Disp: , Rfl:  .  levocetirizine (XYZAL) 5 MG tablet, Take 5  mg by mouth every evening., Disp: , Rfl: 3 .  montelukast (SINGULAIR) 10 MG tablet, Take 10 mg by mouth every evening., Disp: , Rfl: 3   Patient Care Team: Jerrol Banana., MD as PCP - General (Family Medicine)    Objective:    Vitals: BP 113/78   Pulse 84   Temp (!) 97 F (36.1 C) (Temporal)   Resp 18   Ht '6\' 8"'$  (2.032 m)   Wt (!) 371 lb 3.2 oz (168.4 kg)   SpO2 98%   BMI 40.78 kg/m    Vitals:   07/04/19 0911  BP: 113/78  Pulse: 84  Resp: 18  Temp: (!) 97 F (36.1 C)  TempSrc: Temporal  SpO2: 98%  Weight: (!) 371 lb 3.2 oz (168.4 kg)  Height: '6\' 8"'$  (2.032 m)     Physical Exam Vitals reviewed.  Constitutional:      Appearance: Normal appearance. He is obese.  HENT:     Head: Normocephalic and atraumatic.     Right Ear: Tympanic membrane normal.     Left Ear: Tympanic membrane normal.     Nose: Nose normal.     Mouth/Throat:     Mouth: Mucous membranes are moist.     Pharynx: Oropharynx is clear.  Eyes:     General: No scleral icterus.    Conjunctiva/sclera: Conjunctivae normal.     Pupils: Pupils are equal, round, and reactive to light.  Neck:     Vascular: No carotid bruit.  Cardiovascular:     Rate and Rhythm: Normal rate and regular rhythm.     Pulses: Normal pulses.     Heart sounds: Normal heart sounds.  Pulmonary:     Effort: Pulmonary effort is normal.     Breath sounds: Normal breath sounds.  Abdominal:     Palpations: Abdomen is soft.  Genitourinary:    Penis: Normal.      Testes: Normal.     Prostate: Normal.     Rectum: Normal.  Musculoskeletal:        General: No swelling.  Lymphadenopathy:     Cervical: No cervical adenopathy.  Skin:    General: Skin is warm and dry.  Neurological:     General: No focal deficit present.     Mental Status: He is alert and oriented to person, place, and time.  Psychiatric:        Mood and Affect: Mood normal.        Behavior: Behavior normal.        Thought Content: Thought content normal.  Judgment: Judgment normal.      Depression Screen PHQ 2/9 Scores 07/04/2019 07/02/2018 06/30/2017 07/01/2015  PHQ - 2 Score 0 0 0 0  PHQ- 9 Score 2 - - -       Assessment & Plan:     Routine Health Maintenance and Physical Exam  Exercise Activities and Dietary recommendations Goals   None     Immunization History  Administered Date(s) Administered  . Td 06/30/2017  . Tdap 01/10/2007, 09/23/2018    Health Maintenance  Topic Date Due  . HIV Screening  06/09/1992  . INFLUENZA VACCINE  10/23/2019 (Originally 02/23/2019)  . TETANUS/TDAP  09/22/2028     Discussed health benefits of physical activity, and encouraged him to engage in regular exercise appropriate for his age and condition.    --------------------------------------------------------------------  1. Annual physical exam  - POCT Urinalysis Dipstick - TSH - PSA - CBC with Diff - Comp Met (CMET) - Lipid panel  2. Class 2 obesity due to excess calories without serious comorbidity with body mass index (BMI) of 38.0 to 38.9 in adult Diet and exercise discussed at length.  Is a very large man is 6 foot 49 but his BMI of almost 41 is too high.  Stress habits leading to weight loss during our talk.  3. Anxiety, generalized   4. Family history of polyps in the colon Patient says 79 year old sister had precancerous polyps.  Her gastroenterologist recommended him to have GI referral. - Ambulatory referral to Gastroenterology  5. Screening for colon cancer  - Ambulatory referral to Gastroenterology  Follow up in one year for CPE.   Richard Cranford Mon, MD  [facility]

## 2019-07-04 ENCOUNTER — Ambulatory Visit (INDEPENDENT_AMBULATORY_CARE_PROVIDER_SITE_OTHER): Payer: BC Managed Care – PPO | Admitting: Family Medicine

## 2019-07-04 ENCOUNTER — Other Ambulatory Visit: Payer: Self-pay

## 2019-07-04 ENCOUNTER — Encounter: Payer: Self-pay | Admitting: Family Medicine

## 2019-07-04 VITALS — BP 113/78 | HR 84 | Temp 97.0°F | Resp 18 | Ht >= 80 in | Wt 371.2 lb

## 2019-07-04 DIAGNOSIS — Z6838 Body mass index (BMI) 38.0-38.9, adult: Secondary | ICD-10-CM

## 2019-07-04 DIAGNOSIS — Z0000 Encounter for general adult medical examination without abnormal findings: Secondary | ICD-10-CM | POA: Diagnosis not present

## 2019-07-04 DIAGNOSIS — E6609 Other obesity due to excess calories: Secondary | ICD-10-CM | POA: Diagnosis not present

## 2019-07-04 DIAGNOSIS — Z8371 Family history of colonic polyps: Secondary | ICD-10-CM

## 2019-07-04 DIAGNOSIS — J301 Allergic rhinitis due to pollen: Secondary | ICD-10-CM | POA: Diagnosis not present

## 2019-07-04 DIAGNOSIS — E669 Obesity, unspecified: Secondary | ICD-10-CM | POA: Diagnosis not present

## 2019-07-04 DIAGNOSIS — J3089 Other allergic rhinitis: Secondary | ICD-10-CM | POA: Diagnosis not present

## 2019-07-04 DIAGNOSIS — Z125 Encounter for screening for malignant neoplasm of prostate: Secondary | ICD-10-CM | POA: Diagnosis not present

## 2019-07-04 DIAGNOSIS — Z1211 Encounter for screening for malignant neoplasm of colon: Secondary | ICD-10-CM

## 2019-07-04 DIAGNOSIS — F411 Generalized anxiety disorder: Secondary | ICD-10-CM

## 2019-07-04 LAB — POCT URINALYSIS DIPSTICK
Bilirubin, UA: NEGATIVE
Blood, UA: NEGATIVE
Glucose, UA: NEGATIVE
Ketones, UA: NEGATIVE
Leukocytes, UA: NEGATIVE
Nitrite, UA: NEGATIVE
Protein, UA: POSITIVE — AB
Spec Grav, UA: 1.025 (ref 1.010–1.025)
Urobilinogen, UA: 0.2 E.U./dL
pH, UA: 6 (ref 5.0–8.0)

## 2019-07-05 ENCOUNTER — Telehealth: Payer: Self-pay

## 2019-07-05 LAB — COMPREHENSIVE METABOLIC PANEL
ALT: 53 IU/L — ABNORMAL HIGH (ref 0–44)
AST: 32 IU/L (ref 0–40)
Albumin/Globulin Ratio: 2.3 — ABNORMAL HIGH (ref 1.2–2.2)
Albumin: 4.8 g/dL (ref 4.0–5.0)
Alkaline Phosphatase: 55 IU/L (ref 39–117)
BUN/Creatinine Ratio: 14 (ref 9–20)
BUN: 18 mg/dL (ref 6–24)
Bilirubin Total: 0.8 mg/dL (ref 0.0–1.2)
CO2: 21 mmol/L (ref 20–29)
Calcium: 9.6 mg/dL (ref 8.7–10.2)
Chloride: 103 mmol/L (ref 96–106)
Creatinine, Ser: 1.27 mg/dL (ref 0.76–1.27)
GFR calc Af Amer: 80 mL/min/{1.73_m2} (ref >59)
GFR calc non Af Amer: 69 mL/min/{1.73_m2} (ref >59)
Globulin, Total: 2.1 g/dL (ref 1.5–4.5)
Glucose: 88 mg/dL (ref 65–99)
Potassium: 3.8 mmol/L (ref 3.5–5.2)
Sodium: 141 mmol/L (ref 134–144)
Total Protein: 6.9 g/dL (ref 6.0–8.5)

## 2019-07-05 LAB — LIPID PANEL
Chol/HDL Ratio: 5.3 ratio — ABNORMAL HIGH (ref 0.0–5.0)
Cholesterol, Total: 219 mg/dL — ABNORMAL HIGH (ref 100–199)
HDL: 41 mg/dL (ref >39)
LDL Chol Calc (NIH): 137 mg/dL — ABNORMAL HIGH (ref 0–99)
Triglycerides: 227 mg/dL — ABNORMAL HIGH (ref 0–149)
VLDL Cholesterol Cal: 41 mg/dL — ABNORMAL HIGH (ref 5–40)

## 2019-07-05 LAB — CBC WITH DIFFERENTIAL/PLATELET
Basophils Absolute: 0 10*3/uL (ref 0.0–0.2)
Basos: 1 %
EOS (ABSOLUTE): 0.1 10*3/uL (ref 0.0–0.4)
Eos: 3 %
Hematocrit: 44.6 % (ref 37.5–51.0)
Hemoglobin: 15 g/dL (ref 13.0–17.7)
Immature Grans (Abs): 0 10*3/uL (ref 0.0–0.1)
Immature Granulocytes: 0 %
Lymphocytes Absolute: 0.9 10*3/uL (ref 0.7–3.1)
Lymphs: 21 %
MCH: 29.7 pg (ref 26.6–33.0)
MCHC: 33.6 g/dL (ref 31.5–35.7)
MCV: 88 fL (ref 79–97)
Monocytes Absolute: 0.4 10*3/uL (ref 0.1–0.9)
Monocytes: 10 %
Neutrophils Absolute: 2.7 10*3/uL (ref 1.4–7.0)
Neutrophils: 65 %
Platelets: 175 10*3/uL (ref 150–450)
RBC: 5.05 x10E6/uL (ref 4.14–5.80)
RDW: 13.1 % (ref 11.6–15.4)
WBC: 4.2 10*3/uL (ref 3.4–10.8)

## 2019-07-05 LAB — PSA: Prostate Specific Ag, Serum: 0.5 ng/mL (ref 0.0–4.0)

## 2019-07-05 LAB — TSH: TSH: 1.59 u[IU]/mL (ref 0.450–4.500)

## 2019-07-05 NOTE — Telephone Encounter (Signed)
LVM about lab results and for him to call with any questions.

## 2019-07-05 NOTE — Telephone Encounter (Signed)
-----   Message from Jerrol Banana., MD sent at 07/05/2019  8:24 AM EST ----- Labs stable with mild elevation of cholesterol.  Work on diet and exercise as discussed.

## 2019-07-08 ENCOUNTER — Other Ambulatory Visit: Payer: Self-pay

## 2019-07-08 ENCOUNTER — Telehealth: Payer: Self-pay | Admitting: Gastroenterology

## 2019-07-08 DIAGNOSIS — Z1211 Encounter for screening for malignant neoplasm of colon: Secondary | ICD-10-CM

## 2019-07-08 DIAGNOSIS — Z8371 Family history of colonic polyps: Secondary | ICD-10-CM

## 2019-07-08 MED ORDER — NA SULFATE-K SULFATE-MG SULF 17.5-3.13-1.6 GM/177ML PO SOLN: 354.0000 mL | Freq: Once | ORAL | 0 refills | Status: AC

## 2019-07-08 NOTE — Telephone Encounter (Signed)
Pt left vm returning Ashley's call regarding a referral

## 2019-07-08 NOTE — Telephone Encounter (Signed)
Gastroenterology Pre-Procedure Review    PATIENT REVIEW QUESTIONS: The patient responded to the following health history questions as indicated:    1. Are you having any GI issues? no 2. Do you have a personal history of Polyps? no 3. Do you have a family history of Colon Cancer or Polyps? yes (mother) 4. Diabetes Mellitus? no 5. Joint replacements in the past 12 months?no 6. Major health problems in the past 3 months?no 7. Any artificial heart valves, MVP, or defibrillator?no    MEDICATIONS & ALLERGIES:    Patient reports the following regarding taking any anticoagulation/antiplatelet therapy:   Plavix, Coumadin, Eliquis, Xarelto, Lovenox, Pradaxa, Brilinta, or Effient? no Aspirin? no  Patient confirms/reports the following medications:  Current Outpatient Medications  Medication Sig Dispense Refill  . azelastine (ASTELIN) 0.1 % nasal spray azelastine 137 mcg (0.1 %) nasal spray aerosol    . EPINEPHrine 0.3 mg/0.3 mL IJ SOAJ injection epinephrine 0.3 mg/0.3 mL injection, auto-injector    . levocetirizine (XYZAL) 5 MG tablet Take 5 mg by mouth every evening.  3  . montelukast (SINGULAIR) 10 MG tablet Take 10 mg by mouth every evening.  3   No current facility-administered medications for this visit.    Patient confirms/reports the following allergies:  No Known Allergies  No orders of the defined types were placed in this encounter.   AUTHORIZATION INFORMATION Primary Insurance: 1D#: Group #:  Secondary Insurance: 1D#: Group #:  SCHEDULE INFORMATION: Date: 07/30/2019 Time: Location:[facility]

## 2019-07-11 DIAGNOSIS — J3089 Other allergic rhinitis: Secondary | ICD-10-CM | POA: Diagnosis not present

## 2019-07-11 DIAGNOSIS — J301 Allergic rhinitis due to pollen: Secondary | ICD-10-CM | POA: Diagnosis not present

## 2019-07-15 ENCOUNTER — Telehealth: Payer: Self-pay | Admitting: Gastroenterology

## 2019-07-15 NOTE — Telephone Encounter (Signed)
Pt is calling to r/s his procedure for 07/29/18 to push out a week or two.

## 2019-07-22 ENCOUNTER — Telehealth: Payer: Self-pay

## 2019-07-22 NOTE — Telephone Encounter (Signed)
Returned patients call.  LVM asking him to call the office back in regards to r/s his 07/30/19 colonoscopy with Dr. Allen Norris at [facility].  Thanks,  Sharyn Lull

## 2019-07-22 NOTE — Telephone Encounter (Signed)
Colonoscopy has been rescheduled from 07/30/19 to Friday 08/09/19 remains on Dr. Lynnell Jude schedule for [facility].  Lori in Endo made aware of date change.  Referral noted.  Thanks [employer]

## 2019-07-23 DIAGNOSIS — J301 Allergic rhinitis due to pollen: Secondary | ICD-10-CM | POA: Diagnosis not present

## 2019-07-23 DIAGNOSIS — J3089 Other allergic rhinitis: Secondary | ICD-10-CM | POA: Diagnosis not present

## 2019-08-01 DIAGNOSIS — J301 Allergic rhinitis due to pollen: Secondary | ICD-10-CM | POA: Diagnosis not present

## 2019-08-01 DIAGNOSIS — J3089 Other allergic rhinitis: Secondary | ICD-10-CM | POA: Diagnosis not present

## 2019-08-06 ENCOUNTER — Other Ambulatory Visit
Admission: RE | Admit: 2019-08-06 | Discharge: 2019-08-06 | Disposition: A | Discharge status: Home / Self Care | Payer: BC Managed Care – PPO | Source: Ambulatory Visit | Attending: Gastroenterology | Admitting: Gastroenterology

## 2019-08-06 DIAGNOSIS — Z01812 Encounter for preprocedural laboratory examination: Secondary | ICD-10-CM | POA: Insufficient documentation

## 2019-08-06 DIAGNOSIS — Z20822 Contact with and (suspected) exposure to covid-19: Secondary | ICD-10-CM | POA: Diagnosis not present

## 2019-08-06 DIAGNOSIS — J3089 Other allergic rhinitis: Secondary | ICD-10-CM | POA: Diagnosis not present

## 2019-08-06 DIAGNOSIS — J301 Allergic rhinitis due to pollen: Secondary | ICD-10-CM | POA: Diagnosis not present

## 2019-08-06 LAB — SARS CORONAVIRUS 2 (TAT 6-24 HRS): SARS Coronavirus 2: NEGATIVE

## 2019-08-07 ENCOUNTER — Other Ambulatory Visit: Admission: RE | Admit: 2019-08-07 | Payer: BC Managed Care – PPO | Source: Ambulatory Visit

## 2019-08-09 ENCOUNTER — Ambulatory Visit: Payer: BC Managed Care – PPO | Admitting: Certified Registered"

## 2019-08-09 ENCOUNTER — Encounter: Payer: Self-pay | Admitting: Gastroenterology

## 2019-08-09 ENCOUNTER — Ambulatory Visit
Admission: RE | Admit: 2019-08-09 | Discharge: 2019-08-09 | Disposition: A | Discharge status: Home / Self Care | Payer: BC Managed Care – PPO | Attending: Gastroenterology | Admitting: Gastroenterology

## 2019-08-09 ENCOUNTER — Encounter
Admission: RE | Disposition: A | Discharge status: Home / Self Care | Payer: Self-pay | Source: Home / Self Care | Attending: Gastroenterology

## 2019-08-09 ENCOUNTER — Other Ambulatory Visit: Payer: Self-pay

## 2019-08-09 DIAGNOSIS — Z8371 Family history of colonic polyps: Secondary | ICD-10-CM

## 2019-08-09 DIAGNOSIS — Z83719 Family history of colon polyps, unspecified: Secondary | ICD-10-CM

## 2019-08-09 DIAGNOSIS — Z87891 Personal history of nicotine dependence: Secondary | ICD-10-CM | POA: Insufficient documentation

## 2019-08-09 DIAGNOSIS — D124 Benign neoplasm of descending colon: Secondary | ICD-10-CM | POA: Insufficient documentation

## 2019-08-09 DIAGNOSIS — D125 Benign neoplasm of sigmoid colon: Secondary | ICD-10-CM | POA: Insufficient documentation

## 2019-08-09 DIAGNOSIS — K5730 Diverticulosis of large intestine without perforation or abscess without bleeding: Secondary | ICD-10-CM | POA: Insufficient documentation

## 2019-08-09 DIAGNOSIS — K5790 Diverticulosis of intestine, part unspecified, without perforation or abscess without bleeding: Secondary | ICD-10-CM | POA: Diagnosis not present

## 2019-08-09 DIAGNOSIS — Z1211 Encounter for screening for malignant neoplasm of colon: Secondary | ICD-10-CM | POA: Diagnosis not present

## 2019-08-09 DIAGNOSIS — D123 Benign neoplasm of transverse colon: Secondary | ICD-10-CM | POA: Diagnosis not present

## 2019-08-09 DIAGNOSIS — K635 Polyp of colon: Secondary | ICD-10-CM | POA: Diagnosis not present

## 2019-08-09 HISTORY — PX: COLONOSCOPY WITH PROPOFOL: SHX5780

## 2019-08-09 SURGERY — COLONOSCOPY WITH PROPOFOL
Anesthesia: General

## 2019-08-09 MED ORDER — LIDOCAINE HCL (CARDIAC) PF 100 MG/5ML IV SOSY
PREFILLED_SYRINGE | INTRAVENOUS | Status: DC | PRN
  Administered 2019-08-09: 100 mg via INTRATRACHEAL

## 2019-08-09 MED ORDER — PROPOFOL 500 MG/50ML IV EMUL
INTRAVENOUS | Status: DC | PRN
  Administered 2019-08-09: 165 ug/kg/min via INTRAVENOUS

## 2019-08-09 MED ORDER — SODIUM CHLORIDE 0.9 % IV SOLN
INTRAVENOUS | Status: DC
  Administered 2019-08-09: 1000 mL via INTRAVENOUS

## 2019-08-09 MED ORDER — PROPOFOL 10 MG/ML IV BOLUS
INTRAVENOUS | Status: DC | PRN
  Administered 2019-08-09 (×2): 20 mg via INTRAVENOUS
  Administered 2019-08-09: 70 mg via INTRAVENOUS

## 2019-08-09 NOTE — Anesthesia Preprocedure Evaluation (Signed)
Anesthesia Evaluation  Patient identified by MRN, date of birth, ID band Patient awake    Reviewed: Allergy & Precautions, NPO status , Patient's Chart, lab work & pertinent test results  History of Anesthesia Complications Negative for: history of anesthetic complications  Airway Mallampati: II  TM Distance: >3 FB Neck ROM: Full    Dental  (+) Caps   Pulmonary neg sleep apnea, neg COPD, former smoker,    breath sounds clear to auscultation- rhonchi (-) wheezing      Cardiovascular Exercise Tolerance: Good (-) hypertension(-) CAD, (-) Past MI, (-) Cardiac Stents and (-) CABG  Rhythm:Regular Rate:Normal - Systolic murmurs and - Diastolic murmurs    Neuro/Psych neg Seizures PSYCHIATRIC DISORDERS Anxiety negative neurological ROS     GI/Hepatic negative GI ROS, Neg liver ROS,   Endo/Other  negative endocrine ROSneg diabetes  Renal/GU negative Renal ROS     Musculoskeletal negative musculoskeletal ROS (+)   Abdominal (+) + obese,   Peds  Hematology negative hematology ROS (+)   Anesthesia Other Findings Past Medical History: No date: Allergy   Reproductive/Obstetrics                             Anesthesia Physical Anesthesia Plan  ASA: II  Anesthesia Plan: General   Post-op Pain Management:    Induction: Intravenous  PONV Risk Score and Plan: 1 and Propofol infusion  Airway Management Planned: Natural Airway  Additional Equipment:   Intra-op Plan:   Post-operative Plan:   Informed Consent: I have reviewed the patients History and Physical, chart, labs and discussed the procedure including the risks, benefits and alternatives for the proposed anesthesia with the patient or authorized representative who has indicated his/her understanding and acceptance.     Dental advisory given  Plan Discussed with: CRNA and Anesthesiologist  Anesthesia Plan Comments:          Anesthesia Quick Evaluation

## 2019-08-09 NOTE — Op Note (Signed)
[facility] Gastroenterology Patient Name: Andrew Andersen Procedure Date: 08/09/2019 10:21 AM MRN: [id number] Account #: [id number] Date of Birth: [birth year] Admit Type: Outpatient Age: 44 Room: [facility] ENDO ROOM 4 Gender: Male Note Status: Finalized Procedure:             Colonoscopy Indications:           Colon cancer screening in patient at increased risk:                         Family history of 1st-degree relative with colon                         polyps before age 65 years Providers:             Lucilla Lame MD, MD Medicines:             Propofol per Anesthesia Complications:         No immediate complications. Procedure:             Pre-Anesthesia Assessment:                        - Prior to the procedure, a History and Physical was                         performed, and patient medications and allergies were                         reviewed. The patient's tolerance of previous                         anesthesia was also reviewed. The risks and benefits                         of the procedure and the sedation options and risks                         were discussed with the patient. All questions were                         answered, and informed consent was obtained. Prior                         Anticoagulants: The patient has taken no previous                         anticoagulant or antiplatelet agents. ASA Grade                         Assessment: II - A patient with mild systemic disease.                         After reviewing the risks and benefits, the patient                         was deemed in satisfactory condition to undergo the                         procedure.  After obtaining informed consent, the colonoscope was                         passed under direct vision. Throughout the procedure,                         the patient's blood pressure, pulse, and oxygen                         saturations were monitored  continuously. The                         Colonoscope was introduced through the anus and                         advanced to the the cecum, identified by appendiceal                         orifice and ileocecal valve. The colonoscopy was                         performed without difficulty. The patient tolerated                         the procedure well. The quality of the bowel                         preparation was excellent. Findings:      The perianal and digital rectal examinations were normal.      Five sessile polyps were found in the transverse colon. The polyps were       3 to 7 mm in size. These polyps were removed with a cold snare.       Resection and retrieval were complete.      Two sessile polyps were found in the descending colon. The polyps were 4       to 5 mm in size. These polyps were removed with a cold snare. Resection       and retrieval were complete.      A 5 mm polyp was found in the sigmoid colon. The polyp was sessile. The       polyp was removed with a cold snare. Resection and retrieval were       complete.      Multiple small-mouthed diverticula were found in the sigmoid colon. Impression:            - Five 3 to 7 mm polyps in the transverse colon,                         removed with a cold snare. Resected and retrieved.                        - Two 4 to 5 mm polyps in the descending colon,                         removed with a cold snare. Resected and retrieved.                        - One 5 mm polyp in  the sigmoid colon, removed with a                         cold snare. Resected and retrieved.                        - Diverticulosis in the sigmoid colon. Recommendation:        - Discharge patient to home.                        - Resume previous diet.                        - Continue present medications.                        - Await pathology results.                        - Repeat colonoscopy in 3 years for surveillance. Procedure Code(s):      --- Professional ---                        [phone], Colonoscopy, flexible; with removal of                         tumor(s), polyp(s), or other lesion(s) by snare                         technique Diagnosis Code(s):     --- Professional ---                        Z83.71, Family history of colonic polyps                        K63.5, Polyp of colon CPT copyright 2019 American Medical Association. All rights reserved. The codes documented in this report are preliminary and upon coder review may  be revised to meet current compliance requirements. Lucilla Lame MD, MD 08/09/2019 10:46:09 AM This report has been signed electronically. Number of Addenda: 0 Note Initiated On: 08/09/2019 10:21 AM Scope Withdrawal Time: 0 hours 8 minutes 27 seconds  Total Procedure Duration: 0 hours 13 minutes 31 seconds  Estimated Blood Loss:  Estimated blood loss: none.      [facility]

## 2019-08-09 NOTE — Anesthesia Postprocedure Evaluation (Signed)
Anesthesia Post Note  Patient: Andrew Andersen.  Procedure(s) Performed: COLONOSCOPY WITH PROPOFOL (N/A )  Patient location during evaluation: Endoscopy Anesthesia Type: General Level of consciousness: awake and alert and oriented Pain management: pain level controlled Vital Signs Assessment: post-procedure vital signs reviewed and stable Respiratory status: spontaneous breathing, nonlabored ventilation and respiratory function stable Cardiovascular status: blood pressure returned to baseline and stable Postop Assessment: no signs of nausea or vomiting Anesthetic complications: no     Last Vitals:  Vitals:   08/09/19 1056 08/09/19 1110  BP: (!) 144/96 132/85  Pulse: 75 67  Resp: 20 20  Temp: (!) 36.1 C   SpO2:  97%    Last Pain:  Vitals:   08/09/19 1056  TempSrc: Temporal  PainSc:                  Reza Crymes

## 2019-08-09 NOTE — Transfer of Care (Signed)
Immediate Anesthesia Transfer of Care Note  Patient: Andrew Andersen.  Procedure(s) Performed: COLONOSCOPY WITH PROPOFOL (N/A )  Patient Location: Endoscopy Unit  Anesthesia Type:General  Level of Consciousness: awake, alert , oriented and patient cooperative  Airway & Oxygen Therapy: Patient Spontanous Breathing  Post-op Assessment: Report given to RN and Post -op Vital signs reviewed and stable  Post vital signs: Reviewed and stable  Last Vitals:  Vitals Value Taken Time  BP 144/96 08/09/19 1057  Temp 36.1 C 08/09/19 1056  Pulse 72 08/09/19 1058  Resp 14 08/09/19 1058  SpO2 95 % 08/09/19 1058  Vitals shown include unvalidated device data.  Last Pain:  Vitals:   08/09/19 1056  TempSrc: Temporal  PainSc:          Complications: No apparent anesthesia complications

## 2019-08-09 NOTE — H&P (Signed)
Andrew Lame, MD [address] [place], [place] [address] Phone:[phone] Fax : [phone]  Primary Care Physician:  Jerrol Banana., MD Primary Gastroenterologist:  Dr. Allen Norris  Pre-Procedure History & Physical: HPI:  Andrew Andersen. is a 44 y.o. male is here for an colonoscopy.   Past Medical History:  Diagnosis Date  . Allergy     Past Surgical History:  Procedure Laterality Date  . KNEE SURGERY Left     Prior to Admission medications   Medication Sig Start Date End Date Taking? Authorizing Provider  azelastine (ASTELIN) 0.1 % nasal spray azelastine 137 mcg (0.1 %) nasal spray aerosol   Yes [provider]  levocetirizine (XYZAL) 5 MG tablet Take 5 mg by mouth every evening. 06/10/15  Yes [provider]  montelukast (SINGULAIR) 10 MG tablet Take 10 mg by mouth every evening. 06/10/15  Yes [provider]  EPINEPHrine 0.3 mg/0.3 mL IJ SOAJ injection epinephrine 0.3 mg/0.3 mL injection, auto-injector    [provider]    Allergies as of 07/08/2019  . (No Known Allergies)    Family History  Problem Relation Age of Onset  . Hypertension Mother   . Heart disease Maternal Grandmother   . Heart attack Maternal Grandfather   . Dementia Paternal Grandmother   . Alzheimer's disease Paternal Grandmother     Social History   Socioeconomic History  . Marital status: Single    Spouse name: Not on file  . Number of children: Not on file  . Years of education: Not on file  . Highest education level: Not on file  Occupational History  . Not on file  Tobacco Use  . Smoking status: Former Smoker    Packs/day: 1.00    Years: 10.00    Pack years: 10.00    Quit date: 07/24/2012    Years since quitting: 7.0  . Smokeless tobacco: Current User    Types: Snuff    Last attempt to quit: 01/13/2015  Substance and Sexual Activity  . Alcohol use: No  . Drug use: No  . Sexual activity: Not on file  Other Topics  Concern  . Not on file  Social History Narrative  . Not on file   Social Determinants of Health   Financial Resource Strain:   . Difficulty of Paying Living Expenses: Not on file  Food Insecurity:   . Worried About Charity fundraiser in the Last Year: Not on file  . Ran Out of Food in the Last Year: Not on file  Transportation Needs:   . Lack of Transportation (Medical): Not on file  . Lack of Transportation (Non-Medical): Not on file  Physical Activity:   . Days of Exercise per Week: Not on file  . Minutes of Exercise per Session: Not on file  Stress:   . Feeling of Stress : Not on file  Social Connections:   . Frequency of Communication with Friends and Family: Not on file  . Frequency of Social Gatherings with Friends and Family: Not on file  . Attends Religious Services: Not on file  . Active Member of Clubs or Organizations: Not on file  . Attends Archivist Meetings: Not on file  . Marital Status: Not on file  Intimate Partner Violence:   . Fear of Current or Ex-Partner: Not on file  . Emotionally Abused: Not on file  . Physically Abused: Not on file  . Sexually Abused: Not on file  Review of Systems: See HPI, otherwise negative ROS  Physical Exam: BP (!) 138/99   Pulse 81   Temp (!) 97 F (36.1 C) (Temporal)   Resp 20   Ht 6\' 8"  (2.032 m)   Wt (!) 161.4 kg   BMI 39.08 kg/m  General:   Alert,  pleasant and cooperative in NAD Head:  Normocephalic and atraumatic. Neck:  Supple; no masses or thyromegaly. Lungs:  Clear throughout to auscultation.    Heart:  Regular rate and rhythm. Abdomen:  Soft, nontender and nondistended. Normal bowel sounds, without guarding, and without rebound.   Neurologic:  Alert and  oriented x4;  grossly normal neurologically.  Impression/Plan: [name]. is here for an colonoscopy to be performed for family history of polyps  Risks, benefits, limitations, and alternatives regarding  colonoscopy have been  reviewed with the patient.  Questions have been answered.  All parties agreeable.   Andrew Lame, MD  08/09/2019, 10:15 AM

## 2019-08-12 ENCOUNTER — Encounter: Payer: Self-pay | Admitting: *Deleted

## 2019-08-12 LAB — SURGICAL PATHOLOGY

## 2019-08-13 ENCOUNTER — Encounter: Payer: Self-pay | Admitting: Gastroenterology

## 2019-08-15 DIAGNOSIS — J3089 Other allergic rhinitis: Secondary | ICD-10-CM | POA: Diagnosis not present

## 2019-08-15 DIAGNOSIS — J301 Allergic rhinitis due to pollen: Secondary | ICD-10-CM | POA: Diagnosis not present

## 2019-08-20 DIAGNOSIS — J301 Allergic rhinitis due to pollen: Secondary | ICD-10-CM | POA: Diagnosis not present

## 2019-08-20 DIAGNOSIS — J3089 Other allergic rhinitis: Secondary | ICD-10-CM | POA: Diagnosis not present

## 2019-09-03 DIAGNOSIS — J301 Allergic rhinitis due to pollen: Secondary | ICD-10-CM | POA: Diagnosis not present

## 2019-09-03 DIAGNOSIS — J3089 Other allergic rhinitis: Secondary | ICD-10-CM | POA: Diagnosis not present

## 2019-09-20 DIAGNOSIS — J301 Allergic rhinitis due to pollen: Secondary | ICD-10-CM | POA: Diagnosis not present

## 2019-09-20 DIAGNOSIS — J3089 Other allergic rhinitis: Secondary | ICD-10-CM | POA: Diagnosis not present

## 2019-10-01 DIAGNOSIS — J301 Allergic rhinitis due to pollen: Secondary | ICD-10-CM | POA: Diagnosis not present

## 2019-10-01 DIAGNOSIS — J3089 Other allergic rhinitis: Secondary | ICD-10-CM | POA: Diagnosis not present

## 2019-10-17 DIAGNOSIS — J3089 Other allergic rhinitis: Secondary | ICD-10-CM | POA: Diagnosis not present

## 2019-10-17 DIAGNOSIS — J301 Allergic rhinitis due to pollen: Secondary | ICD-10-CM | POA: Diagnosis not present

## 2019-10-29 DIAGNOSIS — J3089 Other allergic rhinitis: Secondary | ICD-10-CM | POA: Diagnosis not present

## 2019-10-29 DIAGNOSIS — J301 Allergic rhinitis due to pollen: Secondary | ICD-10-CM | POA: Diagnosis not present

## 2019-10-30 DIAGNOSIS — J301 Allergic rhinitis due to pollen: Secondary | ICD-10-CM | POA: Diagnosis not present

## 2019-10-31 DIAGNOSIS — J3089 Other allergic rhinitis: Secondary | ICD-10-CM | POA: Diagnosis not present

## 2019-11-05 DIAGNOSIS — J3089 Other allergic rhinitis: Secondary | ICD-10-CM | POA: Diagnosis not present

## 2019-11-05 DIAGNOSIS — J301 Allergic rhinitis due to pollen: Secondary | ICD-10-CM | POA: Diagnosis not present

## 2019-11-12 DIAGNOSIS — J3089 Other allergic rhinitis: Secondary | ICD-10-CM | POA: Diagnosis not present

## 2019-11-12 DIAGNOSIS — J301 Allergic rhinitis due to pollen: Secondary | ICD-10-CM | POA: Diagnosis not present

## 2019-11-26 DIAGNOSIS — J301 Allergic rhinitis due to pollen: Secondary | ICD-10-CM | POA: Diagnosis not present

## 2019-11-26 DIAGNOSIS — J3089 Other allergic rhinitis: Secondary | ICD-10-CM | POA: Diagnosis not present

## 2019-11-29 DIAGNOSIS — J301 Allergic rhinitis due to pollen: Secondary | ICD-10-CM | POA: Diagnosis not present

## 2019-11-29 DIAGNOSIS — J3089 Other allergic rhinitis: Secondary | ICD-10-CM | POA: Diagnosis not present

## 2019-12-03 DIAGNOSIS — J301 Allergic rhinitis due to pollen: Secondary | ICD-10-CM | POA: Diagnosis not present

## 2019-12-03 DIAGNOSIS — J3089 Other allergic rhinitis: Secondary | ICD-10-CM | POA: Diagnosis not present

## 2019-12-06 DIAGNOSIS — J3089 Other allergic rhinitis: Secondary | ICD-10-CM | POA: Diagnosis not present

## 2019-12-06 DIAGNOSIS — J301 Allergic rhinitis due to pollen: Secondary | ICD-10-CM | POA: Diagnosis not present

## 2019-12-10 DIAGNOSIS — J3089 Other allergic rhinitis: Secondary | ICD-10-CM | POA: Diagnosis not present

## 2019-12-10 DIAGNOSIS — J301 Allergic rhinitis due to pollen: Secondary | ICD-10-CM | POA: Diagnosis not present

## 2019-12-17 DIAGNOSIS — J3089 Other allergic rhinitis: Secondary | ICD-10-CM | POA: Diagnosis not present

## 2019-12-17 DIAGNOSIS — J301 Allergic rhinitis due to pollen: Secondary | ICD-10-CM | POA: Diagnosis not present

## 2020-01-02 DIAGNOSIS — H1045 Other chronic allergic conjunctivitis: Secondary | ICD-10-CM | POA: Diagnosis not present

## 2020-01-02 DIAGNOSIS — T781XXD Other adverse food reactions, not elsewhere classified, subsequent encounter: Secondary | ICD-10-CM | POA: Diagnosis not present

## 2020-01-02 DIAGNOSIS — J3089 Other allergic rhinitis: Secondary | ICD-10-CM | POA: Diagnosis not present

## 2020-01-02 DIAGNOSIS — J301 Allergic rhinitis due to pollen: Secondary | ICD-10-CM | POA: Diagnosis not present

## 2020-01-07 DIAGNOSIS — J301 Allergic rhinitis due to pollen: Secondary | ICD-10-CM | POA: Diagnosis not present

## 2020-01-07 DIAGNOSIS — J3089 Other allergic rhinitis: Secondary | ICD-10-CM | POA: Diagnosis not present

## 2020-01-14 DIAGNOSIS — J3089 Other allergic rhinitis: Secondary | ICD-10-CM | POA: Diagnosis not present

## 2020-01-14 DIAGNOSIS — J301 Allergic rhinitis due to pollen: Secondary | ICD-10-CM | POA: Diagnosis not present

## 2020-04-06 DIAGNOSIS — D2271 Melanocytic nevi of right lower limb, including hip: Secondary | ICD-10-CM | POA: Diagnosis not present

## 2020-04-06 DIAGNOSIS — D225 Melanocytic nevi of trunk: Secondary | ICD-10-CM | POA: Diagnosis not present

## 2020-04-06 DIAGNOSIS — D2262 Melanocytic nevi of left upper limb, including shoulder: Secondary | ICD-10-CM | POA: Diagnosis not present

## 2020-04-06 DIAGNOSIS — D2261 Melanocytic nevi of right upper limb, including shoulder: Secondary | ICD-10-CM | POA: Diagnosis not present

## 2020-04-27 DIAGNOSIS — J301 Allergic rhinitis due to pollen: Secondary | ICD-10-CM | POA: Diagnosis not present

## 2020-04-28 DIAGNOSIS — J3089 Other allergic rhinitis: Secondary | ICD-10-CM | POA: Diagnosis not present

## 2020-04-28 DIAGNOSIS — J3081 Allergic rhinitis due to animal (cat) (dog) hair and dander: Secondary | ICD-10-CM | POA: Diagnosis not present

## 2020-05-07 DIAGNOSIS — J3089 Other allergic rhinitis: Secondary | ICD-10-CM | POA: Diagnosis not present

## 2020-05-07 DIAGNOSIS — J301 Allergic rhinitis due to pollen: Secondary | ICD-10-CM | POA: Diagnosis not present

## 2020-05-19 DIAGNOSIS — J301 Allergic rhinitis due to pollen: Secondary | ICD-10-CM | POA: Diagnosis not present

## 2020-05-19 DIAGNOSIS — J3089 Other allergic rhinitis: Secondary | ICD-10-CM | POA: Diagnosis not present

## 2020-05-29 DIAGNOSIS — J3089 Other allergic rhinitis: Secondary | ICD-10-CM | POA: Diagnosis not present

## 2020-05-29 DIAGNOSIS — J301 Allergic rhinitis due to pollen: Secondary | ICD-10-CM | POA: Diagnosis not present

## 2020-06-05 DIAGNOSIS — J3089 Other allergic rhinitis: Secondary | ICD-10-CM | POA: Diagnosis not present

## 2020-06-05 DIAGNOSIS — J301 Allergic rhinitis due to pollen: Secondary | ICD-10-CM | POA: Diagnosis not present

## 2020-06-11 DIAGNOSIS — J3089 Other allergic rhinitis: Secondary | ICD-10-CM | POA: Diagnosis not present

## 2020-06-11 DIAGNOSIS — J301 Allergic rhinitis due to pollen: Secondary | ICD-10-CM | POA: Diagnosis not present

## 2020-06-16 DIAGNOSIS — J301 Allergic rhinitis due to pollen: Secondary | ICD-10-CM | POA: Diagnosis not present

## 2020-06-16 DIAGNOSIS — J3089 Other allergic rhinitis: Secondary | ICD-10-CM | POA: Diagnosis not present

## 2020-07-03 DIAGNOSIS — J3089 Other allergic rhinitis: Secondary | ICD-10-CM | POA: Diagnosis not present

## 2020-07-03 DIAGNOSIS — J301 Allergic rhinitis due to pollen: Secondary | ICD-10-CM | POA: Diagnosis not present

## 2020-07-07 ENCOUNTER — Encounter: Payer: Self-pay | Admitting: Family Medicine

## 2020-07-07 ENCOUNTER — Ambulatory Visit (INDEPENDENT_AMBULATORY_CARE_PROVIDER_SITE_OTHER): Payer: BLUE CROSS/BLUE SHIELD | Admitting: Family Medicine

## 2020-07-07 ENCOUNTER — Other Ambulatory Visit: Payer: Self-pay

## 2020-07-07 VITALS — BP 120/83 | HR 69 | Temp 98.3°F | Resp 16 | Ht >= 80 in | Wt 359.0 lb

## 2020-07-07 DIAGNOSIS — Z125 Encounter for screening for malignant neoplasm of prostate: Secondary | ICD-10-CM | POA: Diagnosis not present

## 2020-07-07 DIAGNOSIS — J3089 Other allergic rhinitis: Secondary | ICD-10-CM

## 2020-07-07 DIAGNOSIS — Z0100 Encounter for examination of eyes and vision without abnormal findings: Secondary | ICD-10-CM

## 2020-07-07 DIAGNOSIS — Z1389 Encounter for screening for other disorder: Secondary | ICD-10-CM | POA: Diagnosis not present

## 2020-07-07 DIAGNOSIS — E6609 Other obesity due to excess calories: Secondary | ICD-10-CM

## 2020-07-07 DIAGNOSIS — F411 Generalized anxiety disorder: Secondary | ICD-10-CM

## 2020-07-07 DIAGNOSIS — Z0000 Encounter for general adult medical examination without abnormal findings: Secondary | ICD-10-CM

## 2020-07-07 DIAGNOSIS — Z6838 Body mass index (BMI) 38.0-38.9, adult: Secondary | ICD-10-CM

## 2020-07-07 LAB — POCT URINALYSIS DIPSTICK
Bilirubin, UA: NEGATIVE
Blood, UA: NEGATIVE
Glucose, UA: NEGATIVE
Ketones, UA: NEGATIVE
Leukocytes, UA: NEGATIVE
Nitrite, UA: NEGATIVE
Protein, UA: NEGATIVE
Spec Grav, UA: 1.02 (ref 1.010–1.025)
Urobilinogen, UA: 0.2 E.U./dL
pH, UA: 6 (ref 5.0–8.0)

## 2020-07-07 NOTE — Progress Notes (Signed)
I,April Miller,acting as a scribe for Wilhemena Durie, MD.,have documented all relevant documentation on the behalf of Wilhemena Durie, MD,as directed by  Wilhemena Durie, MD while in the presence of Wilhemena Durie, MD.   Complete physical exam   Patient: Andrew Andersen.   DOB: 24-Oct-1975   44 y.o. Male  MRN: [id number] Visit Date: 07/07/2020  Today's healthcare provider: Wilhemena Durie, MD   Chief Complaint  Patient presents with  . Annual Exam   Subjective    Andrew Andersen. is a 44 y.o. male who presents today for a complete physical exam.  He reports consuming a general diet. The patient does not participate in regular exercise at present. He generally feels well. He reports sleeping fairly well. He does not have additional problems to discuss today.  HPI    Past Medical History:  Diagnosis Date  . Allergy    Past Surgical History:  Procedure Laterality Date  . COLONOSCOPY WITH PROPOFOL N/A 08/09/2019   Procedure: COLONOSCOPY WITH PROPOFOL;  Surgeon: Lucilla Lame, MD;  Location: [facility] ENDOSCOPY;  Service: Endoscopy;  Laterality: N/A;  . KNEE SURGERY Left    Social History   Socioeconomic History  . Marital status: Single    Spouse name: Not on file  . Number of children: Not on file  . Years of education: Not on file  . Highest education level: Not on file  Occupational History  . Not on file  Tobacco Use  . Smoking status: Former Smoker    Packs/day: 1.00    Years: 10.00    Pack years: 10.00    Quit date: 07/24/2012    Years since quitting: 7.9  . Smokeless tobacco: Current User    Types: Snuff    Last attempt to quit: 01/13/2015  Vaping Use  . Vaping Use: Never used  Substance and Sexual Activity  . Alcohol use: No  . Drug use: No  . Sexual activity: Not on file  Other Topics Concern  . Not on file  Social History Narrative  . Not on file   Social Determinants of Health   Financial Resource Strain: Not on file  Food  Insecurity: Not on file  Transportation Needs: Not on file  Physical Activity: Not on file  Stress: Not on file  Social Connections: Not on file  Intimate Partner Violence: Not on file   Family Status  Relation Name Status  . Mother  Alive  . Father  Alive       pre diabetes  . Sister  Alive  . MGM  Alive  . MGF  Deceased  . PGM  Deceased  . PGF  Deceased   Family History  Problem Relation Age of Onset  . Hypertension Mother   . Heart disease Maternal Grandmother   . Heart attack Maternal Grandfather   . Dementia Paternal Grandmother   . Alzheimer's disease Paternal Grandmother    No Known Allergies  Patient Care Team: Jerrol Banana., MD as PCP - General (Family Medicine)   Medications: Outpatient Medications Prior to Visit  Medication Sig  . EPINEPHrine 0.3 mg/0.3 mL IJ SOAJ injection epinephrine 0.3 mg/0.3 mL injection, auto-injector  . azelastine (ASTELIN) 0.1 % nasal spray azelastine 137 mcg (0.1 %) nasal spray aerosol (Patient not taking: Reported on 07/07/2020)  . levocetirizine (XYZAL) 5 MG tablet Take 5 mg by mouth every evening. (Patient not taking: Reported on 07/07/2020)  . montelukast (SINGULAIR) 10 MG tablet Take  10 mg by mouth every evening. (Patient not taking: Reported on 07/07/2020)   No facility-administered medications prior to visit.    Review of Systems  All other systems reviewed and are negative.      Objective    BP 120/83 (BP Location: Right Arm, Patient Position: Sitting, Cuff Size: Large)   Pulse 69   Temp 98.3 F (36.8 C) (Oral)   Resp 16   Ht 6\' 8"  (2.032 m)   Wt (!) 359 lb (162.8 kg)   SpO2 99%   BMI 39.44 kg/m  Wt Readings from Last 3 Encounters:  07/07/20 (!) 359 lb (162.8 kg)  08/09/19 (!) 355 lb 11.8 oz (161.4 kg)  07/04/19 (!) 371 lb 3.2 oz (168.4 kg)      Physical Exam Vitals reviewed.  Constitutional:      Appearance: Normal appearance. He is obese.  HENT:     Head: Normocephalic and atraumatic.      Right Ear: Tympanic membrane normal.     Left Ear: Tympanic membrane normal.     Nose: Nose normal.     Mouth/Throat:     Mouth: Mucous membranes are moist.     Pharynx: Oropharynx is clear.  Eyes:     General: No scleral icterus.    Conjunctiva/sclera: Conjunctivae normal.     Pupils: Pupils are equal, round, and reactive to light.  Neck:     Vascular: No carotid bruit.  Cardiovascular:     Rate and Rhythm: Normal rate and regular rhythm.     Pulses: Normal pulses.     Heart sounds: Normal heart sounds.  Pulmonary:     Effort: Pulmonary effort is normal.     Breath sounds: Normal breath sounds.  Abdominal:     Palpations: Abdomen is soft.  Genitourinary:    Penis: Normal.      Testes: Normal.     Prostate: Normal.     Rectum: Normal.  Musculoskeletal:        General: No swelling.  Lymphadenopathy:     Cervical: No cervical adenopathy.  Skin:    General: Skin is warm and dry.  Neurological:     General: No focal deficit present.     Mental Status: He is alert and oriented to person, place, and time.  Psychiatric:        Mood and Affect: Mood normal.        Behavior: Behavior normal.        Thought Content: Thought content normal.        Judgment: Judgment normal.       Last depression screening scores PHQ 2/9 Scores 07/07/2020 07/04/2019 07/02/2018  PHQ - 2 Score 0 0 0  PHQ- 9 Score 6 2 -   Last fall risk screening Fall Risk  07/04/2019  Falls in the past year? 0  Number falls in past yr: 0  Injury with Fall? 0  Follow up Falls evaluation completed   Last Audit-C alcohol use screening Alcohol Use Disorder Test (AUDIT) 07/07/2020  1. How often do you have a drink containing alcohol? 2  2. How many drinks containing alcohol do you have on a typical day when you are drinking? 2  3. How often do you have six or more drinks on one occasion? 2  AUDIT-C Score 6  4. How often during the last year have you found that you were not able to stop drinking once you had  started? 0  5. How often during the last year  have you failed to do what was normally expected from you because of drinking? 0  6. How often during the last year have you needed a first drink in the morning to get yourself going after a heavy drinking session? 0  7. How often during the last year have you had a feeling of guilt of remorse after drinking? 0  8. How often during the last year have you been unable to remember what happened the night before because you had been drinking? 0  9. Have you or someone else been injured as a result of your drinking? 0  10. Has a relative or friend or a doctor or another health worker been concerned about your drinking or suggested you cut down? 0  Alcohol Use Disorder Identification Test Final Score (AUDIT) 6  Alcohol Brief Interventions/Follow-up AUDIT Score <7 follow-up not indicated   A score of 3 or more in women, and 4 or more in men indicates increased risk for alcohol abuse, EXCEPT if all of the points are from question 1   No results found for any visits on 07/07/20.  Assessment & Plan    Routine Health Maintenance and Physical Exam  Exercise Activities and Dietary recommendations Goals   None     Immunization History  Administered Date(s) Administered  . Td 06/30/2017  . Tdap 01/10/2007, 09/23/2018    Health Maintenance  Topic Date Due  . Hepatitis C Screening  Never done  . COVID-19 Vaccine (1) Never done  . HIV Screening  Never done  . INFLUENZA VACCINE  Never done  . COLONOSCOPY  08/08/2022  . TETANUS/TDAP  09/22/2028    Discussed health benefits of physical activity, and encouraged him to engage in regular exercise appropriate for his age and condition.  1. Annual physical exam  - Lipid panel - TSH - CBC w/Diff/Platelet - Comprehensive Metabolic Panel (CMET)  2. Anxiety, generalized   3. Seasonal allergic rhinitis due to other allergic trigger   4. Screening for blood or protein in urine  - POCT urinalysis  dipstick  5. Screening for prostate cancer  - PSA  6. Routine eye exam  - Ambulatory referral to Ophthalmology 7.  Obesity Diet and exercise and slow weight loss stressed.  No follow-ups on file.        Saranda Legrande Cranford Mon, MD  [facility] [phone] (phone) [phone] (fax)  [place]

## 2020-07-08 LAB — LIPID PANEL
Chol/HDL Ratio: 6 ratio — ABNORMAL HIGH (ref 0.0–5.0)
Cholesterol, Total: 223 mg/dL — ABNORMAL HIGH (ref 100–199)
HDL: 37 mg/dL — ABNORMAL LOW (ref >39)
LDL Chol Calc (NIH): 146 mg/dL — ABNORMAL HIGH (ref 0–99)
Triglycerides: 219 mg/dL — ABNORMAL HIGH (ref 0–149)
VLDL Cholesterol Cal: 40 mg/dL (ref 5–40)

## 2020-07-08 LAB — COMPREHENSIVE METABOLIC PANEL
ALT: 38 IU/L (ref 0–44)
AST: 24 IU/L (ref 0–40)
Albumin/Globulin Ratio: 2.1 (ref 1.2–2.2)
Albumin: 4.6 g/dL (ref 4.0–5.0)
Alkaline Phosphatase: 61 IU/L (ref 44–121)
BUN/Creatinine Ratio: 15 (ref 9–20)
BUN: 18 mg/dL (ref 6–24)
Bilirubin Total: 0.8 mg/dL (ref 0.0–1.2)
CO2: 24 mmol/L (ref 20–29)
Calcium: 9.3 mg/dL (ref 8.7–10.2)
Chloride: 100 mmol/L (ref 96–106)
Creatinine, Ser: 1.21 mg/dL (ref 0.76–1.27)
GFR calc Af Amer: 84 mL/min/{1.73_m2} (ref >59)
GFR calc non Af Amer: 73 mL/min/{1.73_m2} (ref >59)
Globulin, Total: 2.2 g/dL (ref 1.5–4.5)
Glucose: 90 mg/dL (ref 65–99)
Potassium: 4 mmol/L (ref 3.5–5.2)
Sodium: 138 mmol/L (ref 134–144)
Total Protein: 6.8 g/dL (ref 6.0–8.5)

## 2020-07-08 LAB — CBC WITH DIFFERENTIAL/PLATELET
Basophils Absolute: 0 10*3/uL (ref 0.0–0.2)
Basos: 1 %
EOS (ABSOLUTE): 0.1 10*3/uL (ref 0.0–0.4)
Eos: 3 %
Hematocrit: 42.5 % (ref 37.5–51.0)
Hemoglobin: 14.3 g/dL (ref 13.0–17.7)
Immature Grans (Abs): 0 10*3/uL (ref 0.0–0.1)
Immature Granulocytes: 0 %
Lymphocytes Absolute: 1.1 10*3/uL (ref 0.7–3.1)
Lymphs: 28 %
MCH: 29.5 pg (ref 26.6–33.0)
MCHC: 33.6 g/dL (ref 31.5–35.7)
MCV: 88 fL (ref 79–97)
Monocytes Absolute: 0.4 10*3/uL (ref 0.1–0.9)
Monocytes: 10 %
Neutrophils Absolute: 2.2 10*3/uL (ref 1.4–7.0)
Neutrophils: 58 %
Platelets: 169 10*3/uL (ref 150–450)
RBC: 4.84 x10E6/uL (ref 4.14–5.80)
RDW: 13.1 % (ref 11.6–15.4)
WBC: 3.9 10*3/uL (ref 3.4–10.8)

## 2020-07-08 LAB — TSH: TSH: 1.62 u[IU]/mL (ref 0.450–4.500)

## 2020-07-08 LAB — PSA: Prostate Specific Ag, Serum: 0.5 ng/mL (ref 0.0–4.0)

## 2020-07-14 DIAGNOSIS — J3089 Other allergic rhinitis: Secondary | ICD-10-CM | POA: Diagnosis not present

## 2020-07-14 DIAGNOSIS — J301 Allergic rhinitis due to pollen: Secondary | ICD-10-CM | POA: Diagnosis not present

## 2020-07-15 DIAGNOSIS — H53143 Visual discomfort, bilateral: Secondary | ICD-10-CM | POA: Diagnosis not present

## 2020-07-16 DIAGNOSIS — J301 Allergic rhinitis due to pollen: Secondary | ICD-10-CM | POA: Diagnosis not present

## 2020-07-16 DIAGNOSIS — J3089 Other allergic rhinitis: Secondary | ICD-10-CM | POA: Diagnosis not present

## 2020-07-21 DIAGNOSIS — J3089 Other allergic rhinitis: Secondary | ICD-10-CM | POA: Diagnosis not present

## 2020-07-21 DIAGNOSIS — J301 Allergic rhinitis due to pollen: Secondary | ICD-10-CM | POA: Diagnosis not present

## 2020-07-23 DIAGNOSIS — J301 Allergic rhinitis due to pollen: Secondary | ICD-10-CM | POA: Diagnosis not present

## 2020-07-23 DIAGNOSIS — J3089 Other allergic rhinitis: Secondary | ICD-10-CM | POA: Diagnosis not present

## 2020-08-10 ENCOUNTER — Telehealth (INDEPENDENT_AMBULATORY_CARE_PROVIDER_SITE_OTHER): Payer: BLUE CROSS/BLUE SHIELD | Admitting: Family Medicine

## 2020-08-10 DIAGNOSIS — U071 COVID-19: Secondary | ICD-10-CM

## 2020-08-10 DIAGNOSIS — J029 Acute pharyngitis, unspecified: Secondary | ICD-10-CM

## 2020-08-10 NOTE — Progress Notes (Signed)
MyChart Video Visit    Virtual Visit via Video Note   This visit type was conducted due to national recommendations for restrictions regarding the COVID-19 Pandemic (e.g. social distancing) in an effort to limit this patient's exposure and mitigate transmission in our community. This patient is at least at moderate risk for complications without adequate follow up. This format is felt to be most appropriate for this patient at this time. Physical exam was limited by quality of the video and audio technology used for the visit.   Patient location: home  Provider location: office  I discussed the limitations of evaluation and management by telemedicine and the availability of in person appointments. The patient expressed understanding and agreed to proceed.  Patient: Andrew Andersen.   DOB: 1976/04/13   45 y.o. Male  MRN: [id number] Visit Date: 08/10/2020  Today's healthcare provider: Wilhemena Durie, MD   Chief Complaint  Patient presents with  . Covid Positive  . Sore Throat   Subjective    HPI  Tested positive for Covid 08/09/2020.Home test positive. 7days ago started with sore throat.Chills,fever resolved after 2-3 days. ST and fatigue only. The sore throat bothers him and that he is worried he has strep. Patient Active Problem List   Diagnosis Date Noted  . Family history of colonic polyps   . Polyp of transverse colon   . Allergic rhinitis 01/14/2015  . Anxiety, generalized 01/14/2015  . Cannot sleep 01/14/2015  . 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 Adiposity 01/14/2015   Past Medical History:  Diagnosis Date  . Allergy    Social History   Tobacco Use  . Smoking status: Former Smoker    Packs/day: 1.00    Years: 10.00    Pack years: 10.00    Quit date: 07/24/2012    Years since quitting: 8.0  . Smokeless tobacco: Current User    Types: Snuff    Last attempt to quit: 01/13/2015  Vaping Use  . Vaping Use: Never used  Substance Use Topics  . Alcohol use:  No  . Drug use: No   No Known Allergies  Medications: Outpatient Medications Prior to Visit  Medication Sig  . EPINEPHrine 0.3 mg/0.3 mL IJ SOAJ injection epinephrine 0.3 mg/0.3 mL injection, auto-injector  . azelastine (ASTELIN) 0.1 % nasal spray azelastine 137 mcg (0.1 %) nasal spray aerosol (Patient not taking: No sig reported)  . levocetirizine (XYZAL) 5 MG tablet Take 5 mg by mouth every evening. (Patient not taking: No sig reported)  . montelukast (SINGULAIR) 10 MG tablet Take 10 mg by mouth every evening. (Patient not taking: No sig reported)   No facility-administered medications prior to visit.    Review of Systems  Constitutional: Positive for chills, diaphoresis and fatigue. Negative for activity change, appetite change, fever and unexpected weight change.  HENT: Positive for congestion, ear pain (Bilater ear pain), postnasal drip, sore throat, tinnitus and trouble swallowing. Negative for ear discharge, rhinorrhea, sinus pressure and sinus pain.   Respiratory: Positive for cough. Negative for apnea, choking, chest tightness, shortness of breath, wheezing and stridor.   Gastrointestinal: Negative.   Neurological: Negative for dizziness, light-headedness and headaches.      Objective    There were no vitals taken for this visit.   Physical Exam  The patient is alert and oriented and coherent.  He answers questions with full sentences.  He looks comfortable.  I was able to get a look at the his throat and there are no  exudates and no obvious swelling or tonsillar enlargement. He is able to speak in full sentences and has no shortness of breath or obvious wheezing.   Assessment & Plan     1. COVID Patient is getting over this.  I think the sore throat is due to the COVID.  I think he will continue to improve.  He will let us know if he worsens.  He has having no respiratory symptoms whatsoever now.  2. Sore throat Recommend vitamin C vitamin D and zinc and throat  lozenges.   No follow-ups on file.     I discussed the assessment and treatment plan with the patient. The patient was provided an opportunity to ask questions and all were answered. The patient agreed with the plan and demonstrated an understanding of the instructions.   The patient was advised to call back or seek an in-person evaluation if the symptoms worsen or if the condition fails to improve as anticipated.  I provided 11 minutes of non-face-to-face time during this encounter.    Delio Slates Cranford Mon, MD [facility] [phone] (phone) [phone] (fax)  [place]

## 2020-09-01 DIAGNOSIS — J301 Allergic rhinitis due to pollen: Secondary | ICD-10-CM | POA: Diagnosis not present

## 2020-09-01 DIAGNOSIS — J3089 Other allergic rhinitis: Secondary | ICD-10-CM | POA: Diagnosis not present

## 2020-09-15 DIAGNOSIS — J301 Allergic rhinitis due to pollen: Secondary | ICD-10-CM | POA: Diagnosis not present

## 2020-09-15 DIAGNOSIS — J3089 Other allergic rhinitis: Secondary | ICD-10-CM | POA: Diagnosis not present

## 2020-09-18 DIAGNOSIS — J3089 Other allergic rhinitis: Secondary | ICD-10-CM | POA: Diagnosis not present

## 2020-09-18 DIAGNOSIS — J301 Allergic rhinitis due to pollen: Secondary | ICD-10-CM | POA: Diagnosis not present

## 2020-09-22 DIAGNOSIS — J3089 Other allergic rhinitis: Secondary | ICD-10-CM | POA: Diagnosis not present

## 2020-09-22 DIAGNOSIS — J3081 Allergic rhinitis due to animal (cat) (dog) hair and dander: Secondary | ICD-10-CM | POA: Diagnosis not present

## 2020-09-22 DIAGNOSIS — J301 Allergic rhinitis due to pollen: Secondary | ICD-10-CM | POA: Diagnosis not present

## 2020-10-01 DIAGNOSIS — J3081 Allergic rhinitis due to animal (cat) (dog) hair and dander: Secondary | ICD-10-CM | POA: Diagnosis not present

## 2020-10-01 DIAGNOSIS — J301 Allergic rhinitis due to pollen: Secondary | ICD-10-CM | POA: Diagnosis not present

## 2020-10-01 DIAGNOSIS — J3089 Other allergic rhinitis: Secondary | ICD-10-CM | POA: Diagnosis not present

## 2020-10-08 DIAGNOSIS — J301 Allergic rhinitis due to pollen: Secondary | ICD-10-CM | POA: Diagnosis not present

## 2020-10-08 DIAGNOSIS — J3089 Other allergic rhinitis: Secondary | ICD-10-CM | POA: Diagnosis not present

## 2020-10-20 DIAGNOSIS — J3081 Allergic rhinitis due to animal (cat) (dog) hair and dander: Secondary | ICD-10-CM | POA: Diagnosis not present

## 2020-10-20 DIAGNOSIS — J3089 Other allergic rhinitis: Secondary | ICD-10-CM | POA: Diagnosis not present

## 2020-10-20 DIAGNOSIS — J301 Allergic rhinitis due to pollen: Secondary | ICD-10-CM | POA: Diagnosis not present

## 2020-10-29 DIAGNOSIS — J301 Allergic rhinitis due to pollen: Secondary | ICD-10-CM | POA: Diagnosis not present

## 2020-10-29 DIAGNOSIS — J3089 Other allergic rhinitis: Secondary | ICD-10-CM | POA: Diagnosis not present

## 2020-11-10 DIAGNOSIS — J301 Allergic rhinitis due to pollen: Secondary | ICD-10-CM | POA: Diagnosis not present

## 2020-11-10 DIAGNOSIS — J3089 Other allergic rhinitis: Secondary | ICD-10-CM | POA: Diagnosis not present

## 2020-11-17 DIAGNOSIS — J301 Allergic rhinitis due to pollen: Secondary | ICD-10-CM | POA: Diagnosis not present

## 2020-11-17 DIAGNOSIS — J3089 Other allergic rhinitis: Secondary | ICD-10-CM | POA: Diagnosis not present

## 2020-11-24 DIAGNOSIS — J301 Allergic rhinitis due to pollen: Secondary | ICD-10-CM | POA: Diagnosis not present

## 2020-11-24 DIAGNOSIS — J3089 Other allergic rhinitis: Secondary | ICD-10-CM | POA: Diagnosis not present

## 2020-12-11 DIAGNOSIS — J301 Allergic rhinitis due to pollen: Secondary | ICD-10-CM | POA: Diagnosis not present

## 2020-12-11 DIAGNOSIS — J3089 Other allergic rhinitis: Secondary | ICD-10-CM | POA: Diagnosis not present

## 2020-12-24 DIAGNOSIS — J301 Allergic rhinitis due to pollen: Secondary | ICD-10-CM | POA: Diagnosis not present

## 2020-12-24 DIAGNOSIS — J3089 Other allergic rhinitis: Secondary | ICD-10-CM | POA: Diagnosis not present

## 2021-01-05 DIAGNOSIS — T781XXD Other adverse food reactions, not elsewhere classified, subsequent encounter: Secondary | ICD-10-CM | POA: Diagnosis not present

## 2021-01-05 DIAGNOSIS — H1045 Other chronic allergic conjunctivitis: Secondary | ICD-10-CM | POA: Diagnosis not present

## 2021-01-05 DIAGNOSIS — J3089 Other allergic rhinitis: Secondary | ICD-10-CM | POA: Diagnosis not present

## 2021-01-05 DIAGNOSIS — J301 Allergic rhinitis due to pollen: Secondary | ICD-10-CM | POA: Diagnosis not present

## 2021-01-12 DIAGNOSIS — J301 Allergic rhinitis due to pollen: Secondary | ICD-10-CM | POA: Diagnosis not present

## 2021-01-12 DIAGNOSIS — J3089 Other allergic rhinitis: Secondary | ICD-10-CM | POA: Diagnosis not present

## 2021-01-19 DIAGNOSIS — J3089 Other allergic rhinitis: Secondary | ICD-10-CM | POA: Diagnosis not present

## 2021-01-19 DIAGNOSIS — J301 Allergic rhinitis due to pollen: Secondary | ICD-10-CM | POA: Diagnosis not present

## 2021-02-01 DIAGNOSIS — J301 Allergic rhinitis due to pollen: Secondary | ICD-10-CM | POA: Diagnosis not present

## 2021-02-02 DIAGNOSIS — J3089 Other allergic rhinitis: Secondary | ICD-10-CM | POA: Diagnosis not present

## 2021-02-04 DIAGNOSIS — J301 Allergic rhinitis due to pollen: Secondary | ICD-10-CM | POA: Diagnosis not present

## 2021-02-04 DIAGNOSIS — J3089 Other allergic rhinitis: Secondary | ICD-10-CM | POA: Diagnosis not present

## 2021-02-09 DIAGNOSIS — J3089 Other allergic rhinitis: Secondary | ICD-10-CM | POA: Diagnosis not present

## 2021-02-09 DIAGNOSIS — J301 Allergic rhinitis due to pollen: Secondary | ICD-10-CM | POA: Diagnosis not present

## 2021-02-23 DIAGNOSIS — J301 Allergic rhinitis due to pollen: Secondary | ICD-10-CM | POA: Diagnosis not present

## 2021-02-23 DIAGNOSIS — J3089 Other allergic rhinitis: Secondary | ICD-10-CM | POA: Diagnosis not present

## 2021-03-02 DIAGNOSIS — J301 Allergic rhinitis due to pollen: Secondary | ICD-10-CM | POA: Diagnosis not present

## 2021-03-02 DIAGNOSIS — J3089 Other allergic rhinitis: Secondary | ICD-10-CM | POA: Diagnosis not present

## 2021-03-09 DIAGNOSIS — J3089 Other allergic rhinitis: Secondary | ICD-10-CM | POA: Diagnosis not present

## 2021-03-09 DIAGNOSIS — J301 Allergic rhinitis due to pollen: Secondary | ICD-10-CM | POA: Diagnosis not present

## 2021-03-23 DIAGNOSIS — J301 Allergic rhinitis due to pollen: Secondary | ICD-10-CM | POA: Diagnosis not present

## 2021-03-23 DIAGNOSIS — J3089 Other allergic rhinitis: Secondary | ICD-10-CM | POA: Diagnosis not present

## 2021-04-06 DIAGNOSIS — J3089 Other allergic rhinitis: Secondary | ICD-10-CM | POA: Diagnosis not present

## 2021-04-06 DIAGNOSIS — J301 Allergic rhinitis due to pollen: Secondary | ICD-10-CM | POA: Diagnosis not present

## 2021-04-22 DIAGNOSIS — J301 Allergic rhinitis due to pollen: Secondary | ICD-10-CM | POA: Diagnosis not present

## 2021-04-22 DIAGNOSIS — J3089 Other allergic rhinitis: Secondary | ICD-10-CM | POA: Diagnosis not present

## 2021-05-04 DIAGNOSIS — J301 Allergic rhinitis due to pollen: Secondary | ICD-10-CM | POA: Diagnosis not present

## 2021-05-04 DIAGNOSIS — J3089 Other allergic rhinitis: Secondary | ICD-10-CM | POA: Diagnosis not present

## 2021-05-18 DIAGNOSIS — J301 Allergic rhinitis due to pollen: Secondary | ICD-10-CM | POA: Diagnosis not present

## 2021-05-18 DIAGNOSIS — J3089 Other allergic rhinitis: Secondary | ICD-10-CM | POA: Diagnosis not present

## 2021-05-25 DIAGNOSIS — J3089 Other allergic rhinitis: Secondary | ICD-10-CM | POA: Diagnosis not present

## 2021-05-25 DIAGNOSIS — J301 Allergic rhinitis due to pollen: Secondary | ICD-10-CM | POA: Diagnosis not present

## 2021-06-22 DIAGNOSIS — J3089 Other allergic rhinitis: Secondary | ICD-10-CM | POA: Diagnosis not present

## 2021-06-22 DIAGNOSIS — J301 Allergic rhinitis due to pollen: Secondary | ICD-10-CM | POA: Diagnosis not present

## 2021-06-23 DIAGNOSIS — D225 Melanocytic nevi of trunk: Secondary | ICD-10-CM | POA: Diagnosis not present

## 2021-06-23 DIAGNOSIS — D2261 Melanocytic nevi of right upper limb, including shoulder: Secondary | ICD-10-CM | POA: Diagnosis not present

## 2021-06-23 DIAGNOSIS — D2262 Melanocytic nevi of left upper limb, including shoulder: Secondary | ICD-10-CM | POA: Diagnosis not present

## 2021-06-23 DIAGNOSIS — D2272 Melanocytic nevi of left lower limb, including hip: Secondary | ICD-10-CM | POA: Diagnosis not present

## 2021-06-29 DIAGNOSIS — J3089 Other allergic rhinitis: Secondary | ICD-10-CM | POA: Diagnosis not present

## 2021-06-29 DIAGNOSIS — J301 Allergic rhinitis due to pollen: Secondary | ICD-10-CM | POA: Diagnosis not present

## 2021-07-01 DIAGNOSIS — J3089 Other allergic rhinitis: Secondary | ICD-10-CM | POA: Diagnosis not present

## 2021-07-01 DIAGNOSIS — J301 Allergic rhinitis due to pollen: Secondary | ICD-10-CM | POA: Diagnosis not present

## 2021-07-08 ENCOUNTER — Encounter: Payer: Self-pay | Admitting: Family Medicine

## 2021-07-13 DIAGNOSIS — J301 Allergic rhinitis due to pollen: Secondary | ICD-10-CM | POA: Diagnosis not present

## 2021-07-13 DIAGNOSIS — J3089 Other allergic rhinitis: Secondary | ICD-10-CM | POA: Diagnosis not present

## 2021-07-15 DIAGNOSIS — J301 Allergic rhinitis due to pollen: Secondary | ICD-10-CM | POA: Diagnosis not present

## 2021-07-15 DIAGNOSIS — J3089 Other allergic rhinitis: Secondary | ICD-10-CM | POA: Diagnosis not present

## 2021-07-22 DIAGNOSIS — J3089 Other allergic rhinitis: Secondary | ICD-10-CM | POA: Diagnosis not present

## 2021-07-22 DIAGNOSIS — J301 Allergic rhinitis due to pollen: Secondary | ICD-10-CM | POA: Diagnosis not present

## 2021-08-03 DIAGNOSIS — J301 Allergic rhinitis due to pollen: Secondary | ICD-10-CM | POA: Diagnosis not present

## 2021-08-03 DIAGNOSIS — J3089 Other allergic rhinitis: Secondary | ICD-10-CM | POA: Diagnosis not present

## 2021-08-17 DIAGNOSIS — J3089 Other allergic rhinitis: Secondary | ICD-10-CM | POA: Diagnosis not present

## 2021-08-17 DIAGNOSIS — J301 Allergic rhinitis due to pollen: Secondary | ICD-10-CM | POA: Diagnosis not present

## 2021-08-26 DIAGNOSIS — J301 Allergic rhinitis due to pollen: Secondary | ICD-10-CM | POA: Diagnosis not present

## 2021-08-26 DIAGNOSIS — J3089 Other allergic rhinitis: Secondary | ICD-10-CM | POA: Diagnosis not present

## 2021-08-31 DIAGNOSIS — J3089 Other allergic rhinitis: Secondary | ICD-10-CM | POA: Diagnosis not present

## 2021-08-31 DIAGNOSIS — J301 Allergic rhinitis due to pollen: Secondary | ICD-10-CM | POA: Diagnosis not present

## 2021-09-14 DIAGNOSIS — J3089 Other allergic rhinitis: Secondary | ICD-10-CM | POA: Diagnosis not present

## 2021-09-14 DIAGNOSIS — J3081 Allergic rhinitis due to animal (cat) (dog) hair and dander: Secondary | ICD-10-CM | POA: Diagnosis not present

## 2021-09-14 DIAGNOSIS — J301 Allergic rhinitis due to pollen: Secondary | ICD-10-CM | POA: Diagnosis not present

## 2021-10-07 DIAGNOSIS — J3089 Other allergic rhinitis: Secondary | ICD-10-CM | POA: Diagnosis not present

## 2021-10-07 DIAGNOSIS — J301 Allergic rhinitis due to pollen: Secondary | ICD-10-CM | POA: Diagnosis not present

## 2021-10-12 DIAGNOSIS — J301 Allergic rhinitis due to pollen: Secondary | ICD-10-CM | POA: Diagnosis not present

## 2021-10-12 DIAGNOSIS — J3089 Other allergic rhinitis: Secondary | ICD-10-CM | POA: Diagnosis not present

## 2021-10-21 DIAGNOSIS — J301 Allergic rhinitis due to pollen: Secondary | ICD-10-CM | POA: Diagnosis not present

## 2021-10-21 DIAGNOSIS — J3089 Other allergic rhinitis: Secondary | ICD-10-CM | POA: Diagnosis not present

## 2021-10-26 DIAGNOSIS — J301 Allergic rhinitis due to pollen: Secondary | ICD-10-CM | POA: Diagnosis not present

## 2021-10-26 DIAGNOSIS — J3089 Other allergic rhinitis: Secondary | ICD-10-CM | POA: Diagnosis not present

## 2021-10-26 NOTE — Progress Notes (Signed)
? ? ? ?Complete physical exam ? ?I,Andrew Andersen,acting as a scribe for Wilhemena Durie, MD.,have documented all relevant documentation on the behalf of Wilhemena Durie, MD,as directed by  Wilhemena Durie, MD while in the presence of Wilhemena Durie, MD. ? ? ? ?Patient: Andrew Andersen.   DOB: Dec 02, 1975   46 y.o. Male  MRN: [id number] ?Visit Date: 11/01/2021 ? ?Today's healthcare provider: Wilhemena Durie, MD  ? ?Chief Complaint  ?Patient presents with  ? Annual Exam  ? ?Subjective  ?  ?Andrew Andersen. is a 46 y.o. male who presents today for a complete physical exam.  ?He reports consuming a general diet. Home exercise routine includes walks a lot at [employer]. He generally feels fairly well. He reports sleeping fairly well. He does not have additional problems to discuss today.  ?He has no regular exercise.  Overall he feels well. ?HPI  ? ? ?Past Medical History:  ?Diagnosis Date  ? Allergy   ? ?Past Surgical History:  ?Procedure Laterality Date  ? COLONOSCOPY WITH PROPOFOL N/A 08/09/2019  ? Procedure: COLONOSCOPY WITH PROPOFOL;  Surgeon: Lucilla Lame, MD;  Location: [facility] ENDOSCOPY;  Service: Endoscopy;  Laterality: N/A;  ? KNEE SURGERY Left   ? ?Social History  ? ?Socioeconomic History  ? Marital status: Single  ?  Spouse name: Not on file  ? Number of children: Not on file  ? Years of education: Not on file  ? Highest education level: Not on file  ?Occupational History  ? Not on file  ?Tobacco Use  ? Smoking status: Former  ?  Packs/day: 1.00  ?  Years: 10.00  ?  Pack years: 10.00  ?  Types: Cigarettes  ?  Quit date: 07/24/2012  ?  Years since quitting: 9.2  ? Smokeless tobacco: Current  ?  Types: Snuff  ?  Last attempt to quit: 01/13/2015  ?Vaping Use  ? Vaping Use: Never used  ?Substance and Sexual Activity  ? Alcohol use: No  ? Drug use: No  ? Sexual activity: Not on file  ?Other Topics Concern  ? Not on file  ?Social History Narrative  ? Not on file  ? ?Social Determinants of Health   ? ?Financial Resource Strain: Not on file  ?Food Insecurity: Not on file  ?Transportation Needs: Not on file  ?Physical Activity: Not on file  ?Stress: Not on file  ?Social Connections: Not on file  ?Intimate Partner Violence: Not on file  ? ?Family Status  ?Relation Name Status  ? Mother  Alive  ? Father  Alive  ?     pre diabetes  ? Sister  Alive  ? MGM  Alive  ? MGF  Deceased  ? PGM  Deceased  ? PGF  Deceased  ? ?Family History  ?Problem Relation Age of Onset  ? Hypertension Mother   ? Heart disease Maternal Grandmother   ? Heart attack Maternal Grandfather   ? Dementia Paternal Grandmother   ? Alzheimer's disease Paternal Grandmother   ? ?No Known Allergies  ?Patient Care Team: ?Jerrol Banana., MD as PCP - General (Family Medicine)  ? ?Medications: ?Outpatient Medications Prior to Visit  ?Medication Sig  ? azelastine (ASTELIN) 0.1 % nasal spray azelastine 137 mcg (0.1 %) nasal spray aerosol  ? EPINEPHrine 0.3 mg/0.3 mL IJ SOAJ injection epinephrine 0.3 mg/0.3 mL injection, auto-injector  ? levocetirizine (XYZAL) 5 MG tablet Take 5 mg by mouth every evening.  ? montelukast (SINGULAIR) 10 MG  tablet Take 10 mg by mouth every evening.  ? ?No facility-administered medications prior to visit.  ? ? ?Review of Systems  ?HENT:  Positive for tinnitus.   ?Musculoskeletal:  Positive for arthralgias.  ?All other systems reviewed and are negative. ? ?Last lipids ?Lab Results  ?Component Value Date  ? CHOL 231 (H) 11/01/2021  ? HDL 42 11/01/2021  ? LDLCALC 137 (H) 11/01/2021  ? TRIG 290 (H) 11/01/2021  ? CHOLHDL 5.5 (H) 11/01/2021  ? ?  ? Objective  ?  ?BP 137/82 (BP Location: Right Arm, Patient Position: Sitting, Cuff Size: Large)   Pulse 75   Temp 97.6 ?F (36.4 ?C) (Temporal)   Resp 16   Ht '6\' 8"'$  (2.032 m)   Wt (!) 375 lb (170.1 kg)   SpO2 95%   BMI 41.20 kg/m?  ?BP Readings from Last 3 Encounters:  ?11/01/21 137/82  ?07/07/20 120/83  ?08/09/19 132/85  ? ?Wt Readings from Last 3 Encounters:  ?11/01/21 (!) 375  lb (170.1 kg)  ?07/07/20 (!) 359 lb (162.8 kg)  ?08/09/19 (!) 355 lb 11.8 oz (161.4 kg)  ? ?  ? ? ?Physical Exam ?Vitals reviewed.  ?Constitutional:   ?   Appearance: Normal appearance. He is obese.  ?HENT:  ?   Head: Normocephalic and atraumatic.  ?   Right Ear: Tympanic membrane normal.  ?   Left Ear: Tympanic membrane normal.  ?   Nose: Nose normal.  ?   Mouth/Throat:  ?   Mouth: Mucous membranes are moist.  ?   Pharynx: Oropharynx is clear.  ?Eyes:  ?   General: No scleral icterus. ?   Conjunctiva/sclera: Conjunctivae normal.  ?   Pupils: Pupils are equal, round, and reactive to light.  ?Neck:  ?   Vascular: No carotid bruit.  ?Cardiovascular:  ?   Rate and Rhythm: Normal rate and regular rhythm.  ?   Pulses: Normal pulses.  ?   Heart sounds: Normal heart sounds.  ?Pulmonary:  ?   Effort: Pulmonary effort is normal.  ?   Breath sounds: Normal breath sounds.  ?Abdominal:  ?   Palpations: Abdomen is soft.  ?Genitourinary: ?   Penis: Normal.   ?   Testes: Normal.  ?Musculoskeletal:     ?   General: No swelling.  ?   Comments: He has calluses on both feet with no other podiatric problems obvious  ?Lymphadenopathy:  ?   Cervical: No cervical adenopathy.  ?Skin: ?   General: Skin is warm and dry.  ?Neurological:  ?   General: No focal deficit present.  ?   Mental Status: He is alert and oriented to person, place, and time.  ?Psychiatric:     ?   Mood and Affect: Mood normal.     ?   Behavior: Behavior normal.     ?   Thought Content: Thought content normal.     ?   Judgment: Judgment normal.  ?  ? ? ?Last depression screening scores ? ?  11/01/2021  ?  9:58 AM 07/07/2020  ?  9:56 AM 07/04/2019  ?  9:10 AM  ?PHQ 2/9 Scores  ?PHQ - 2 Score 1 0 0  ?PHQ- 9 Score '5 6 2  '$ ? ?Last fall risk screening ? ?  11/01/2021  ?  9:58 AM  ?Fall Risk   ?Falls in the past year? 0  ?Number falls in past yr: 0  ?Injury with Fall? 0  ?Risk for fall due to :  No Fall Risks  ?Follow up Falls evaluation completed  ? ?Last Audit-C alcohol use  screening ? ?  11/01/2021  ?  9:57 AM  ?Alcohol Use Disorder Test (AUDIT)  ?1. How often do you have a drink containing alcohol? 2  ?2. How many drinks containing alcohol do you have on a typical day when you are drinking? 3  ?3. How often do you have six or more drinks on one occasion? 2  ?AUDIT-C Score 7  ?4. How often during the last year have you found that you were not able to stop drinking once you had started? 0  ?5. How often during the last year have you failed to do what was normally expected from you because of drinking? 0  ?6. How often during the last year have you needed a first drink in the morning to get yourself going after a heavy drinking session? 0  ?7. How often during the last year have you had a feeling of guilt of remorse after drinking? 0  ?8. How often during the last year have you been unable to remember what happened the night before because you had been drinking? 0  ?9. Have you or someone else been injured as a result of your drinking? 0  ?10. Has a relative or friend or a doctor or another health worker been concerned about your drinking or suggested you cut down? 0  ?Alcohol Use Disorder Identification Test Final Score (AUDIT) 7  ? ?A score of 3 or more in women, and 4 or more in men indicates increased risk for alcohol abuse, EXCEPT if all of the points are from question 1  ? ?No results found for any visits on 11/01/21. ? Assessment & Plan  ?  ?Routine Health Maintenance and Physical Exam ? ?Exercise Activities and Dietary recommendations ? Goals   ?None ?  ? ? ?Immunization History  ?Administered Date(s) Administered  ? Td 06/30/2017  ? Tdap 01/10/2007, 09/23/2018  ? ? ?Health Maintenance  ?Topic Date Due  ? COVID-19 Vaccine (1) Never done  ? HIV Screening  Never done  ? Hepatitis C Screening  Never done  ? INFLUENZA VACCINE  02/22/2022  ? COLONOSCOPY (Pts 45-27yr Insurance coverage will need to be confirmed)  08/08/2022  ? TETANUS/TDAP  09/22/2028  ? HPV VACCINES  Aged Out   ? ? ?Discussed health benefits of physical activity, and encouraged him to engage in regular exercise appropriate for his age and condition. ? ?1. Annual physical exam ?Patient needs follow-up colonoscopy in January 24.  I will

## 2021-11-01 ENCOUNTER — Ambulatory Visit (INDEPENDENT_AMBULATORY_CARE_PROVIDER_SITE_OTHER): Payer: BC Managed Care – PPO | Admitting: Family Medicine

## 2021-11-01 ENCOUNTER — Encounter: Payer: Self-pay | Admitting: Family Medicine

## 2021-11-01 VITALS — BP 137/82 | HR 75 | Temp 97.6°F | Resp 16 | Ht >= 80 in | Wt 375.0 lb

## 2021-11-01 DIAGNOSIS — Z0000 Encounter for general adult medical examination without abnormal findings: Secondary | ICD-10-CM

## 2021-11-01 DIAGNOSIS — Z6838 Body mass index (BMI) 38.0-38.9, adult: Secondary | ICD-10-CM | POA: Diagnosis not present

## 2021-11-01 DIAGNOSIS — E6609 Other obesity due to excess calories: Secondary | ICD-10-CM

## 2021-11-01 DIAGNOSIS — Z125 Encounter for screening for malignant neoplasm of prostate: Secondary | ICD-10-CM

## 2021-11-01 DIAGNOSIS — Z1389 Encounter for screening for other disorder: Secondary | ICD-10-CM

## 2021-11-01 LAB — POCT URINALYSIS DIPSTICK
Bilirubin, UA: NEGATIVE
Blood, UA: NEGATIVE
Glucose, UA: NEGATIVE
Ketones, UA: NEGATIVE
Leukocytes, UA: NEGATIVE
Nitrite, UA: NEGATIVE
Protein, UA: NEGATIVE
Spec Grav, UA: 1.025 (ref 1.010–1.025)
Urobilinogen, UA: 0.2 E.U./dL
pH, UA: 6.5 (ref 5.0–8.0)

## 2021-11-02 LAB — LIPID PANEL
Chol/HDL Ratio: 5.5 ratio — ABNORMAL HIGH (ref 0.0–5.0)
Cholesterol, Total: 231 mg/dL — ABNORMAL HIGH (ref 100–199)
HDL: 42 mg/dL (ref >39)
LDL Chol Calc (NIH): 137 mg/dL — ABNORMAL HIGH (ref 0–99)
Triglycerides: 290 mg/dL — ABNORMAL HIGH (ref 0–149)
VLDL Cholesterol Cal: 52 mg/dL — ABNORMAL HIGH (ref 5–40)

## 2021-11-02 LAB — CBC WITH DIFFERENTIAL/PLATELET
Basophils Absolute: 0.1 10*3/uL (ref 0.0–0.2)
Basos: 1 %
EOS (ABSOLUTE): 0.1 10*3/uL (ref 0.0–0.4)
Eos: 3 %
Hematocrit: 43.2 % (ref 37.5–51.0)
Hemoglobin: 14.7 g/dL (ref 13.0–17.7)
Immature Grans (Abs): 0 10*3/uL (ref 0.0–0.1)
Immature Granulocytes: 0 %
Lymphocytes Absolute: 1 10*3/uL (ref 0.7–3.1)
Lymphs: 26 %
MCH: 29.5 pg (ref 26.6–33.0)
MCHC: 34 g/dL (ref 31.5–35.7)
MCV: 87 fL (ref 79–97)
Monocytes Absolute: 0.4 10*3/uL (ref 0.1–0.9)
Monocytes: 10 %
Neutrophils Absolute: 2.3 10*3/uL (ref 1.4–7.0)
Neutrophils: 60 %
Platelets: 187 10*3/uL (ref 150–450)
RBC: 4.98 x10E6/uL (ref 4.14–5.80)
RDW: 13.9 % (ref 11.6–15.4)
WBC: 3.8 10*3/uL (ref 3.4–10.8)

## 2021-11-02 LAB — COMPREHENSIVE METABOLIC PANEL
ALT: 43 IU/L (ref 0–44)
AST: 32 IU/L (ref 0–40)
Albumin/Globulin Ratio: 2.1 (ref 1.2–2.2)
Albumin: 4.8 g/dL (ref 4.0–5.0)
Alkaline Phosphatase: 63 IU/L (ref 44–121)
BUN/Creatinine Ratio: 13 (ref 9–20)
BUN: 15 mg/dL (ref 6–24)
Bilirubin Total: 0.7 mg/dL (ref 0.0–1.2)
CO2: 26 mmol/L (ref 20–29)
Calcium: 9.6 mg/dL (ref 8.7–10.2)
Chloride: 100 mmol/L (ref 96–106)
Creatinine, Ser: 1.17 mg/dL (ref 0.76–1.27)
Globulin, Total: 2.3 g/dL (ref 1.5–4.5)
Glucose: 99 mg/dL (ref 70–99)
Potassium: 3.7 mmol/L (ref 3.5–5.2)
Sodium: 140 mmol/L (ref 134–144)
Total Protein: 7.1 g/dL (ref 6.0–8.5)
eGFR: 79 mL/min/{1.73_m2} (ref >59)

## 2021-11-02 LAB — PSA: Prostate Specific Ag, Serum: 0.6 ng/mL (ref 0.0–4.0)

## 2021-11-02 LAB — TSH: TSH: 2.11 u[IU]/mL (ref 0.450–4.500)

## 2021-11-04 DIAGNOSIS — J3089 Other allergic rhinitis: Secondary | ICD-10-CM | POA: Diagnosis not present

## 2021-11-04 DIAGNOSIS — J301 Allergic rhinitis due to pollen: Secondary | ICD-10-CM | POA: Diagnosis not present

## 2021-11-18 DIAGNOSIS — J3089 Other allergic rhinitis: Secondary | ICD-10-CM | POA: Diagnosis not present

## 2021-11-18 DIAGNOSIS — J301 Allergic rhinitis due to pollen: Secondary | ICD-10-CM | POA: Diagnosis not present

## 2021-11-25 DIAGNOSIS — J3089 Other allergic rhinitis: Secondary | ICD-10-CM | POA: Diagnosis not present

## 2021-11-25 DIAGNOSIS — J301 Allergic rhinitis due to pollen: Secondary | ICD-10-CM | POA: Diagnosis not present

## 2021-12-02 DIAGNOSIS — J301 Allergic rhinitis due to pollen: Secondary | ICD-10-CM | POA: Diagnosis not present

## 2021-12-02 DIAGNOSIS — J3089 Other allergic rhinitis: Secondary | ICD-10-CM | POA: Diagnosis not present

## 2021-12-10 DIAGNOSIS — J301 Allergic rhinitis due to pollen: Secondary | ICD-10-CM | POA: Diagnosis not present

## 2021-12-11 DIAGNOSIS — J3089 Other allergic rhinitis: Secondary | ICD-10-CM | POA: Diagnosis not present

## 2021-12-16 DIAGNOSIS — J3089 Other allergic rhinitis: Secondary | ICD-10-CM | POA: Diagnosis not present

## 2021-12-16 DIAGNOSIS — J301 Allergic rhinitis due to pollen: Secondary | ICD-10-CM | POA: Diagnosis not present

## 2021-12-30 DIAGNOSIS — J3089 Other allergic rhinitis: Secondary | ICD-10-CM | POA: Diagnosis not present

## 2021-12-30 DIAGNOSIS — J301 Allergic rhinitis due to pollen: Secondary | ICD-10-CM | POA: Diagnosis not present

## 2021-12-30 DIAGNOSIS — H1045 Other chronic allergic conjunctivitis: Secondary | ICD-10-CM | POA: Diagnosis not present

## 2021-12-30 DIAGNOSIS — T781XXD Other adverse food reactions, not elsewhere classified, subsequent encounter: Secondary | ICD-10-CM | POA: Diagnosis not present

## 2022-01-06 DIAGNOSIS — J3089 Other allergic rhinitis: Secondary | ICD-10-CM | POA: Diagnosis not present

## 2022-01-06 DIAGNOSIS — J3081 Allergic rhinitis due to animal (cat) (dog) hair and dander: Secondary | ICD-10-CM | POA: Diagnosis not present

## 2022-01-06 DIAGNOSIS — J301 Allergic rhinitis due to pollen: Secondary | ICD-10-CM | POA: Diagnosis not present

## 2022-01-14 DIAGNOSIS — J301 Allergic rhinitis due to pollen: Secondary | ICD-10-CM | POA: Diagnosis not present

## 2022-01-14 DIAGNOSIS — J3089 Other allergic rhinitis: Secondary | ICD-10-CM | POA: Diagnosis not present

## 2022-02-08 DIAGNOSIS — J301 Allergic rhinitis due to pollen: Secondary | ICD-10-CM | POA: Diagnosis not present

## 2022-02-08 DIAGNOSIS — J3089 Other allergic rhinitis: Secondary | ICD-10-CM | POA: Diagnosis not present

## 2022-02-22 DIAGNOSIS — J301 Allergic rhinitis due to pollen: Secondary | ICD-10-CM | POA: Diagnosis not present

## 2022-02-22 DIAGNOSIS — J3089 Other allergic rhinitis: Secondary | ICD-10-CM | POA: Diagnosis not present

## 2022-03-03 DIAGNOSIS — J3089 Other allergic rhinitis: Secondary | ICD-10-CM | POA: Diagnosis not present

## 2022-03-03 DIAGNOSIS — J301 Allergic rhinitis due to pollen: Secondary | ICD-10-CM | POA: Diagnosis not present

## 2022-03-08 DIAGNOSIS — J301 Allergic rhinitis due to pollen: Secondary | ICD-10-CM | POA: Diagnosis not present

## 2022-03-08 DIAGNOSIS — J3089 Other allergic rhinitis: Secondary | ICD-10-CM | POA: Diagnosis not present

## 2022-03-24 DIAGNOSIS — J3089 Other allergic rhinitis: Secondary | ICD-10-CM | POA: Diagnosis not present

## 2022-03-24 DIAGNOSIS — J301 Allergic rhinitis due to pollen: Secondary | ICD-10-CM | POA: Diagnosis not present

## 2022-04-08 DIAGNOSIS — J3089 Other allergic rhinitis: Secondary | ICD-10-CM | POA: Diagnosis not present

## 2022-04-08 DIAGNOSIS — J301 Allergic rhinitis due to pollen: Secondary | ICD-10-CM | POA: Diagnosis not present

## 2022-04-14 DIAGNOSIS — J301 Allergic rhinitis due to pollen: Secondary | ICD-10-CM | POA: Diagnosis not present

## 2022-04-14 DIAGNOSIS — J3089 Other allergic rhinitis: Secondary | ICD-10-CM | POA: Diagnosis not present

## 2022-04-21 DIAGNOSIS — J301 Allergic rhinitis due to pollen: Secondary | ICD-10-CM | POA: Diagnosis not present

## 2022-04-21 DIAGNOSIS — J3089 Other allergic rhinitis: Secondary | ICD-10-CM | POA: Diagnosis not present

## 2022-04-26 DIAGNOSIS — J301 Allergic rhinitis due to pollen: Secondary | ICD-10-CM | POA: Diagnosis not present

## 2022-04-26 DIAGNOSIS — J3089 Other allergic rhinitis: Secondary | ICD-10-CM | POA: Diagnosis not present

## 2022-05-17 DIAGNOSIS — J3081 Allergic rhinitis due to animal (cat) (dog) hair and dander: Secondary | ICD-10-CM | POA: Diagnosis not present

## 2022-05-17 DIAGNOSIS — J301 Allergic rhinitis due to pollen: Secondary | ICD-10-CM | POA: Diagnosis not present

## 2022-05-17 DIAGNOSIS — J3089 Other allergic rhinitis: Secondary | ICD-10-CM | POA: Diagnosis not present

## 2022-05-27 DIAGNOSIS — J3089 Other allergic rhinitis: Secondary | ICD-10-CM | POA: Diagnosis not present

## 2022-05-27 DIAGNOSIS — J301 Allergic rhinitis due to pollen: Secondary | ICD-10-CM | POA: Diagnosis not present

## 2022-05-31 ENCOUNTER — Telehealth: Payer: Self-pay

## 2022-05-31 NOTE — Telephone Encounter (Signed)
Copied from [place] [id number]. Topic: Referral - Request for Referral >> May 31, 2022 10:52 AM Everette C wrote: Has patient seen PCP for this complaint? No. *If NO, is insurance requiring patient see PCP for this issue before PCP can refer them? Referral for which specialty: Sleep Specialist  Preferred provider/office: Patient has no preference but would like for them to be located in [place] [facility]  Reason for referral: Patient would like to have a sleep study done

## 2022-05-31 NOTE — Telephone Encounter (Signed)
Patient scheduled for tomorrow with Dr. Quentin Cornwall

## 2022-06-01 ENCOUNTER — Encounter: Payer: Self-pay | Admitting: Family Medicine

## 2022-06-01 ENCOUNTER — Ambulatory Visit: Payer: BC Managed Care – PPO | Admitting: Family Medicine

## 2022-06-01 VITALS — BP 141/104 | HR 71 | Resp 16 | Ht >= 80 in | Wt 380.0 lb

## 2022-06-01 DIAGNOSIS — G4730 Sleep apnea, unspecified: Secondary | ICD-10-CM | POA: Diagnosis not present

## 2022-06-01 DIAGNOSIS — R030 Elevated blood-pressure reading, without diagnosis of hypertension: Secondary | ICD-10-CM | POA: Insufficient documentation

## 2022-06-01 DIAGNOSIS — K635 Polyp of colon: Secondary | ICD-10-CM | POA: Diagnosis not present

## 2022-06-01 DIAGNOSIS — F418 Other specified anxiety disorders: Secondary | ICD-10-CM

## 2022-06-01 NOTE — Assessment & Plan Note (Signed)
Patient with increasing anxiety with driving Patient given contact information for Dr. Nicolasa Ducking to schedule appointment

## 2022-06-01 NOTE — Patient Instructions (Addendum)
Dr. Cephus Shelling - Psychiatry   [address], [place] [address]  Please call to schedule an appointment.   I would like to see you in 3-4 weeks to follow up on your elevated blood pressure today.    I have placed the referrals for gastroenterology (colonoscopy) and pulmonary medicine for the sleep study.    Be on the lookout for a call from these two offices to schedule an appointment.    Take Care,   Dr. Quentin Cornwall

## 2022-06-01 NOTE — Assessment & Plan Note (Signed)
Referral to gastroenterology placed for 3-year follow-up for multiple polyps
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
## 2022-06-01 NOTE — Assessment & Plan Note (Signed)
High suspicion for OSA We will refer to pulmonology for sleep study and recommendations regarding CPAP if deemed appropriate Patient answers positively for daytime sleepiness, snoring and frequent arousals during the night due to changes in his breathing

## 2022-06-01 NOTE — Assessment & Plan Note (Signed)
Blood pressure elevated on 2 measurements, 140s/100 Attributes this to increased anxiety due to having to drive to and from this appointment No prior history of hypertension or treatment for hypertension Patient advised to follow-up in 3 to 4 weeks We discussed that he may need CMP at follow-up if blood pressure remains elevated, patient voiced understanding

## 2022-06-02 DIAGNOSIS — J301 Allergic rhinitis due to pollen: Secondary | ICD-10-CM | POA: Diagnosis not present

## 2022-06-02 DIAGNOSIS — J3089 Other allergic rhinitis: Secondary | ICD-10-CM | POA: Diagnosis not present

## 2022-06-03 ENCOUNTER — Telehealth: Payer: Self-pay

## 2022-06-03 ENCOUNTER — Other Ambulatory Visit: Payer: Self-pay

## 2022-06-03 DIAGNOSIS — Z86010 Personal history of colonic polyps: Secondary | ICD-10-CM

## 2022-06-03 MED ORDER — NA SULFATE-K SULFATE-MG SULF 17.5-3.13-1.6 GM/177ML PO SOLN: 1.0000 | Freq: Once | ORAL | 0 refills | Status: AC

## 2022-06-03 NOTE — Telephone Encounter (Signed)
Gastroenterology Pre-Procedure Review  Request Date: 08/02/22 Requesting Physician: Dr. Allen Norris  PATIENT REVIEW QUESTIONS: The patient responded to the following health history questions as indicated:    1. Are you having any GI issues? no 2. Do you have a personal history of Polyps? Yes last colonoscopy performed by Dr. Allen Norris 08/09/19 3. Do you have a family history of Colon Cancer or Polyps? yes (sister colon polyps) 4. Diabetes Mellitus? no 5. Joint replacements in the past 12 months?no 6. Major health problems in the past 3 months?no 7. Any artificial heart valves, MVP, or defibrillator?no    MEDICATIONS & ALLERGIES:    Patient reports the following regarding taking any anticoagulation/antiplatelet therapy:   Plavix, Coumadin, Eliquis, Xarelto, Lovenox, Pradaxa, Brilinta, or Effient? no Aspirin? no  Patient confirms/reports the following medications:  Current Outpatient Medications  Medication Sig Dispense Refill   EPINEPHrine 0.3 mg/0.3 mL IJ SOAJ injection epinephrine 0.3 mg/0.3 mL injection, auto-injector     levocetirizine (XYZAL) 5 MG tablet Take 5 mg by mouth every evening.  3   montelukast (SINGULAIR) 10 MG tablet Take 10 mg by mouth every evening.  3   No current facility-administered medications for this visit.    Patient confirms/reports the following allergies:  No Known Allergies  No orders of the defined types were placed in this encounter.   AUTHORIZATION INFORMATION Primary Insurance: 1D#: Group #:  Secondary Insurance: 1D#: Group #:  SCHEDULE INFORMATION: Date: 08/02/22 Time: Location: [facility]

## 2022-06-06 ENCOUNTER — Ambulatory Visit: Payer: BC Managed Care – PPO | Admitting: Family Medicine

## 2022-06-09 DIAGNOSIS — J301 Allergic rhinitis due to pollen: Secondary | ICD-10-CM | POA: Diagnosis not present

## 2022-06-09 DIAGNOSIS — J3089 Other allergic rhinitis: Secondary | ICD-10-CM | POA: Diagnosis not present

## 2022-06-09 DIAGNOSIS — J3081 Allergic rhinitis due to animal (cat) (dog) hair and dander: Secondary | ICD-10-CM | POA: Diagnosis not present

## 2022-06-15 ENCOUNTER — Other Ambulatory Visit: Payer: Self-pay

## 2022-06-15 ENCOUNTER — Encounter: Payer: Self-pay | Admitting: Primary Care

## 2022-06-15 ENCOUNTER — Ambulatory Visit (INDEPENDENT_AMBULATORY_CARE_PROVIDER_SITE_OTHER): Payer: BC Managed Care – PPO | Admitting: Primary Care

## 2022-06-15 VITALS — BP 142/90 | HR 74 | Temp 98.5°F | Ht >= 80 in | Wt 380.2 lb

## 2022-06-15 DIAGNOSIS — G4730 Sleep apnea, unspecified: Secondary | ICD-10-CM | POA: Diagnosis not present

## 2022-06-15 DIAGNOSIS — R0681 Apnea, not elsewhere classified: Secondary | ICD-10-CM

## 2022-06-15 DIAGNOSIS — R0683 Snoring: Secondary | ICD-10-CM

## 2022-06-15 NOTE — Patient Instructions (Signed)
Sleep apnea is defined as period of 10 seconds or longer when you stop breathing at night. This can happen multiple times a night. Dx sleep apnea is when this occurs more than 5 times an hour.    Mild OSA 5-15 apneic events an hour Moderate OSA 15-30 apneic events an hour Severe OSA > 30 apneic events an hour   Untreated sleep apnea puts you at higher risk for cardiac arrhythmias, pulmonary HTN, stroke and diabetes  Treatment options include weight loss, side sleeping position, oral appliance, CPAP therapy or referral to ENT for possible surgical options    Recommendations: Focus on side sleeping position or elevate head with wedge pillow 30 degrees Do not drive if experiencing excessive daytime sleepiness of fatigue  Work on weight loss efforts if able, goal BMI <30   Orders: Home sleep study re: loud snoring    Follow-up: Please call to schedule follow-up 1-2 weeks after completing home sleep study to review results and treatment if needed (can be virtual)   Sleep Apnea Sleep apnea is a condition in which breathing pauses or becomes shallow during sleep. People with sleep apnea usually snore loudly. They may have times when they gasp and stop breathing for 10 seconds or more during sleep. This may happen many times during the night. Sleep apnea disrupts your sleep and keeps your body from getting the rest that it needs. This condition can increase your risk of certain health problems, including: Heart attack. Stroke. Obesity. Type 2 diabetes. Heart failure. Irregular heartbeat. High blood pressure. The goal of treatment is to help you breathe normally again. What are the causes?  The most common cause of sleep apnea is a collapsed or blocked airway. There are three kinds of sleep apnea: Obstructive sleep apnea. This kind is caused by a blocked or collapsed airway. Central sleep apnea. This kind happens when the part of the brain that controls breathing does not send the  correct signals to the muscles that control breathing. Mixed sleep apnea. This is a combination of obstructive and central sleep apnea. What increases the risk? You are more likely to develop this condition if you: Are overweight. Smoke. Have a smaller than normal airway. Are older. Are male. Drink alcohol. Take sedatives or tranquilizers. Have a family history of sleep apnea. Have a tongue or tonsils that are larger than normal. What are the signs or symptoms? Symptoms of this condition include: Trouble staying asleep. Loud snoring. Morning headaches. Waking up gasping. Dry mouth or sore throat in the morning. Daytime sleepiness and tiredness. If you have daytime fatigue because of sleep apnea, you may be more likely to have: Trouble concentrating. Forgetfulness. Irritability or mood swings. Personality changes. Feelings of depression. Sexual dysfunction. This may include loss of interest if you are male, or erectile dysfunction if you are male. How is this diagnosed? This condition may be diagnosed with: A medical history. A physical exam. A series of tests that are done while you are sleeping (sleep study). These tests are usually done in a sleep lab, but they may also be done at home. How is this treated? Treatment for this condition aims to restore normal breathing and to ease symptoms during sleep. It may involve managing health issues that can affect breathing, such as high blood pressure or obesity. Treatment may include: Sleeping on your side. Using a decongestant if you have nasal congestion. Avoiding the use of depressants, including alcohol, sedatives, and narcotics. Losing weight if you are overweight. Making changes  to your diet. Quitting smoking. Using a device to open your airway while you sleep, such as: An oral appliance. This is a custom-made mouthpiece that shifts your lower jaw forward. A continuous positive airway pressure (CPAP) device. This device  blows air through a mask when you breathe out (exhale). A nasal expiratory positive airway pressure (EPAP) device. This device has valves that you put into each nostril. A bi-level positive airway pressure (BIPAP) device. This device blows air through a mask when you breathe in (inhale) and breathe out (exhale). Having surgery if other treatments do not work. During surgery, excess tissue is removed to create a wider airway. Follow these instructions at home: Lifestyle Make any lifestyle changes that your health care provider recommends. Eat a healthy, well-balanced diet. Take steps to lose weight if you are overweight. Avoid using depressants, including alcohol, sedatives, and narcotics. Do not use any products that contain nicotine or tobacco. These products include cigarettes, chewing tobacco, and vaping devices, such as e-cigarettes. If you need help quitting, ask your health care provider. General instructions Take over-the-counter and prescription medicines only as told by your health care provider. If you were given a device to open your airway while you sleep, use it only as told by your health care provider. If you are having surgery, make sure to tell your health care provider you have sleep apnea. You may need to bring your device with you. Keep all follow-up visits. This is important. Contact a health care provider if: The device that you received to open your airway during sleep is uncomfortable or does not seem to be working. Your symptoms do not improve. Your symptoms get worse. Get help right away if: You develop: Chest pain. Shortness of breath. Discomfort in your back, arms, or stomach. You have: Trouble speaking. Weakness on one side of your body. Drooping in your face. These symptoms may represent a serious problem that is an emergency. Do not wait to see if the symptoms will go away. Get medical help right away. Call your local emergency services (911 in the [place]  Do not drive yourself to the hospital. Summary Sleep apnea is a condition in which breathing pauses or becomes shallow during sleep. The most common cause is a collapsed or blocked airway. The goal of treatment is to restore normal breathing and to ease symptoms during sleep. This information is not intended to replace advice given to you by your health care provider. Make sure you discuss any questions you have with your health care provider. Document Revised: 02/17/2021 Document Reviewed: 06/19/2020 Elsevier Patient Education  [place].

## 2022-06-15 NOTE — Progress Notes (Signed)
$'@Patient'F$  ID: Andrew Andersen., male    DOB: 1976-03-05, 46 y.o.   MRN: [id number]  Chief Complaint  Patient presents with   Consult    Referring provider: Donnal Moat*  HPI: 46 year old male, former smoker quit in 2013 (10-pack-year history).  Past medical history significant for elevated blood pressure, generalized anxiety, allergic rhinitis, sleep apnea.  06/15/2022 Patient presents today for sleep consult. He has symptoms of snoring which will at times wake him up. He feels tired throughout the day. He has anxiety while driving. He will not fall asleep during the daytime unless sitting still and reading. He was told by anesthesiology when he had colonoscopy 3 years ago that he should be elevated for sleep apnea. Typical bedtimes is between 1am-3am. It takes 30-60 mins to fall asleep. He wakes up several times a night. He starts his day at 10am. Weight is some 10-15 lbs. No previous sleep study. He does not wear CPAP or oxygen. Epworth 4/24. Denies symptoms of narcolepsy, cataplexy or sleep walking.   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 Do you currently wear CPAP- no Do you current wear oxygen- no Epworth- 4   No Known Allergies  Immunization History  Administered Date(s) Administered   Td 06/30/2017   Tdap 01/10/2007, 09/23/2018    Past Medical History:  Diagnosis Date   Allergy     Tobacco History: Social History   Tobacco Use  Smoking Status Former   Packs/day: 1.00   Years: 10.00   Total pack years: 10.00   Types: Cigarettes   Quit date: 07/24/2012   Years since quitting: 9.9   Passive exposure: Past  Smokeless Tobacco Current   Types: Snuff   Last attempt to quit: 01/13/2015   Ready to quit: Not  Answered Counseling given: Not Answered   Outpatient Medications Prior to Visit  Medication Sig Dispense Refill   EPINEPHrine 0.3 mg/0.3 mL IJ SOAJ injection epinephrine 0.3 mg/0.3 mL injection, auto-injector     levocetirizine (XYZAL) 5 MG tablet Take 5 mg by mouth every evening.  3   montelukast (SINGULAIR) 10 MG tablet Take 10 mg by mouth every evening.  3   No facility-administered medications prior to visit.    Review of Systems  Review of Systems  Constitutional:  Positive for fatigue.  Respiratory:  Positive for apnea.   Psychiatric/Behavioral:  Positive for sleep disturbance.      Physical Exam  BP (!) 142/90 (BP Location: Right Arm, Patient Position: Sitting, Cuff Size: Large)   Pulse 74   Temp 98.5 F (36.9 C) (Oral)   Ht '6\' 8"'$  (2.032 m)   Wt (!) 380 lb 3.2 oz (172.5 kg)   SpO2 97%   BMI 41.77 kg/m  Physical Exam Constitutional:      General: He is not in acute distress.    Appearance: Normal appearance. He is obese. He is not ill-appearing.  HENT:     Head: Normocephalic and atraumatic.  Cardiovascular:     Rate and Rhythm: Normal rate and regular rhythm.  Pulmonary:     Effort: Pulmonary effort is normal.     Breath sounds: Normal breath sounds.  Musculoskeletal:        General: Normal range of motion.  Skin:    General: Skin is  warm and dry.  Neurological:     General: No focal deficit present.     Mental Status: He is alert and oriented to person, place, and time. Mental status is at baseline.  Psychiatric:        Mood and Affect: Mood normal.        Behavior: Behavior normal.        Thought Content: Thought content normal.        Judgment: Judgment normal.      Lab Results:  CBC    Component Value Date/Time   WBC 3.8 11/01/2021 1038   WBC 4.5 06/30/2017 0952   RBC 4.98 11/01/2021 1038   RBC 4.85 06/30/2017 0952   HGB 14.7 11/01/2021 1038   HCT 43.2 11/01/2021 1038   PLT 187 11/01/2021 1038   MCV 87 11/01/2021 1038   MCH 29.5  11/01/2021 1038   MCH 29.5 06/30/2017 0952   MCHC 34.0 11/01/2021 1038   MCHC 34.4 06/30/2017 0952   RDW 13.9 11/01/2021 1038   LYMPHSABS 1.0 11/01/2021 1038   EOSABS 0.1 11/01/2021 1038   BASOSABS 0.1 11/01/2021 1038    BMET    Component Value Date/Time   NA 140 11/01/2021 1038   K 3.7 11/01/2021 1038   CL 100 11/01/2021 1038   CO2 26 11/01/2021 1038   GLUCOSE 99 11/01/2021 1038   GLUCOSE 100 (H) 06/30/2017 0952   BUN 15 11/01/2021 1038   CREATININE 1.17 11/01/2021 1038   CREATININE 1.17 06/30/2017 0952   CALCIUM 9.6 11/01/2021 1038   GFRNONAA 73 07/07/2020 1113   GFRNONAA 78 06/30/2017 0952   GFRAA 84 07/07/2020 1113   GFRAA 90 06/30/2017 0952    BNP No results found for: "BNP"  ProBNP No results found for: "PROBNP"  Imaging: No results found.   Assessment & Plan:   Sleep apnea in adult - Patient has symptoms of loud snoring, apnea and sleep disruption. No significant daytime somnolence. BMI 41. Epworth score 4/24. Moderate-high suspicion patient has underlying obstructive sleep apnea, needs home sleep study to evaluate. Discussed risks of untreated sleep apnea including cardiac arrhthymias, pum HTN, stroke and DM. Reviewed treatment options including weight loss, oral appliance, CPAP therapy or referral to ENT for possible surgical options. Encourage weight loss and focus on side sleeping position. Advised against driving if experiencing excessive daytime sleepiness. FU 1-2 weeks after sleep study to review results and treatment options if needed.    Martyn Ehrich, NP 06/19/2022

## 2022-06-19 NOTE — Assessment & Plan Note (Signed)
-   Patient has symptoms of loud snoring, apnea and sleep disruption. No significant daytime somnolence. BMI 41. Epworth score 4/24. Moderate-high suspicion patient has underlying obstructive sleep apnea, needs home sleep study to evaluate. Discussed risks of untreated sleep apnea including cardiac arrhthymias, pum HTN, stroke and DM. Reviewed treatment options including weight loss, oral appliance, CPAP therapy or referral to ENT for possible surgical options. Encourage weight loss and focus on side sleeping position. Advised against driving if experiencing excessive daytime sleepiness. FU 1-2 weeks after sleep study to review results and treatment options if needed.

## 2022-06-20 NOTE — Progress Notes (Signed)
Reviewed and agree with assessment/plan.   Chesley Mires, MD [facility] Pulmonary/Critical Care 06/20/2022, 7:40 AM Pager:  [phone]

## 2022-06-23 DIAGNOSIS — D2261 Melanocytic nevi of right upper limb, including shoulder: Secondary | ICD-10-CM | POA: Diagnosis not present

## 2022-06-23 DIAGNOSIS — D225 Melanocytic nevi of trunk: Secondary | ICD-10-CM | POA: Diagnosis not present

## 2022-06-23 DIAGNOSIS — D485 Neoplasm of uncertain behavior of skin: Secondary | ICD-10-CM | POA: Diagnosis not present

## 2022-06-23 DIAGNOSIS — J3081 Allergic rhinitis due to animal (cat) (dog) hair and dander: Secondary | ICD-10-CM | POA: Diagnosis not present

## 2022-06-23 DIAGNOSIS — D2271 Melanocytic nevi of right lower limb, including hip: Secondary | ICD-10-CM | POA: Diagnosis not present

## 2022-06-23 DIAGNOSIS — D2262 Melanocytic nevi of left upper limb, including shoulder: Secondary | ICD-10-CM | POA: Diagnosis not present

## 2022-06-23 DIAGNOSIS — J3089 Other allergic rhinitis: Secondary | ICD-10-CM | POA: Diagnosis not present

## 2022-06-23 DIAGNOSIS — J301 Allergic rhinitis due to pollen: Secondary | ICD-10-CM | POA: Diagnosis not present

## 2022-06-28 NOTE — Progress Notes (Unsigned)
      Established patient visit   Patient: Andrew Andersen.   DOB: 13-Dec-1975   46 y.o. Male  MRN: [id number] Visit Date: 06/29/2022  Today's healthcare provider: Eulis Foster, MD   No chief complaint on file.  Subjective    HPI  Hypertension: Patient here for follow-up of elevated blood pressure.  Cardiac symptoms {Symptoms; cardiac:12860}. Patient denies {Symptoms; cardiac:12860}.  Cardiovascular risk factors: male gender and obesity (BMI >= 30 kg/m2). Not on BP medications.  BP Readings from Last 3 Encounters:  06/15/22 (!) 142/90  06/01/22 (!) 141/104  11/01/21 137/82    Wt Readings from Last 3 Encounters:  06/15/22 (!) 380 lb 3.2 oz (172.5 kg)  06/01/22 (!) 380 lb (172.4 kg)  11/01/21 (!) 375 lb (170.1 kg)     Medications: Outpatient Medications Prior to Visit  Medication Sig   EPINEPHrine 0.3 mg/0.3 mL IJ SOAJ injection epinephrine 0.3 mg/0.3 mL injection, auto-injector   No facility-administered medications prior to visit.    Review of Systems  {Labs  Heme  Chem  Endocrine  Serology  Results Review (optional):23779}   Objective    There were no vitals taken for this visit. {Show previous vital signs (optional):23777}  Physical Exam  ***  No results found for any visits on 06/29/22.  Assessment & Plan     ***  No follow-ups on file.      {provider attestation***:1}   Eulis Foster, MD  [facility] [phone] (phone) [phone] (fax)  [place]

## 2022-06-29 ENCOUNTER — Ambulatory Visit: Payer: BC Managed Care – PPO | Admitting: Family Medicine

## 2022-06-29 ENCOUNTER — Encounter: Payer: Self-pay | Admitting: Family Medicine

## 2022-06-29 VITALS — BP 146/99 | HR 105 | Wt 385.0 lb

## 2022-06-29 DIAGNOSIS — R030 Elevated blood-pressure reading, without diagnosis of hypertension: Secondary | ICD-10-CM

## 2022-06-29 MED ORDER — LOSARTAN POTASSIUM 25 MG PO TABS: 25.0000 mg | ORAL_TABLET | Freq: Every day | ORAL | 0 refills | Status: DC

## 2022-06-29 NOTE — Assessment & Plan Note (Addendum)
Remains elevated and on recheck. Will start low dose losartan. Much discussion had today regarding weight loss as previous measurements within normal limits with lower weight. Also high suspicion for OSA, awaiting sleep study. Suspect as weight improves and sleep apnea treated, may not need ongoing pharamacologic antihypertensive. Obtaining CMP today. F/u in 3 months after sleep study with hopeful weight loss at that time.

## 2022-06-29 NOTE — Patient Instructions (Signed)
It was great to see you!  Our plans for today:  - Keep an eye on your blood pressure at home. Use an arm cuff (not wrist cuff) as these are more accurate. Make sure to check when you have been sitting at rest for at least 5 minutes and not in pain.  - Come back in 3 months for follow up.  We are checking some labs today, we will release these results to your MyChart.  Take care and seek immediate care sooner if you develop any concerns.   Dr. Ky Barban

## 2022-06-29 NOTE — Assessment & Plan Note (Signed)
Likely contributing to elevated BP and sleep apnea. Recommend weight loss through diet and exercise.

## 2022-06-30 LAB — COMPREHENSIVE METABOLIC PANEL WITH GFR
ALT: 52 IU/L — ABNORMAL HIGH (ref 0–44)
AST: 31 IU/L (ref 0–40)
Albumin/Globulin Ratio: 2.5 — ABNORMAL HIGH (ref 1.2–2.2)
Albumin: 4.7 g/dL (ref 4.1–5.1)
Alkaline Phosphatase: 67 IU/L (ref 44–121)
BUN/Creatinine Ratio: 13 (ref 9–20)
BUN: 15 mg/dL (ref 6–24)
Bilirubin Total: 0.5 mg/dL (ref 0.0–1.2)
CO2: 23 mmol/L (ref 20–29)
Calcium: 9.5 mg/dL (ref 8.7–10.2)
Chloride: 102 mmol/L (ref 96–106)
Creatinine, Ser: 1.12 mg/dL (ref 0.76–1.27)
Globulin, Total: 1.9 g/dL (ref 1.5–4.5)
Glucose: 103 mg/dL — ABNORMAL HIGH (ref 70–99)
Potassium: 4 mmol/L (ref 3.5–5.2)
Sodium: 141 mmol/L (ref 134–144)
Total Protein: 6.6 g/dL (ref 6.0–8.5)
eGFR: 83 mL/min/1.73

## 2022-07-08 DIAGNOSIS — J301 Allergic rhinitis due to pollen: Secondary | ICD-10-CM | POA: Diagnosis not present

## 2022-07-08 DIAGNOSIS — J3089 Other allergic rhinitis: Secondary | ICD-10-CM | POA: Diagnosis not present

## 2022-07-08 DIAGNOSIS — J3081 Allergic rhinitis due to animal (cat) (dog) hair and dander: Secondary | ICD-10-CM | POA: Diagnosis not present

## 2022-07-13 DIAGNOSIS — F320 Major depressive disorder, single episode, mild: Secondary | ICD-10-CM | POA: Diagnosis not present

## 2022-07-13 DIAGNOSIS — F409 Phobic anxiety disorder, unspecified: Secondary | ICD-10-CM | POA: Diagnosis not present

## 2022-07-13 DIAGNOSIS — F5105 Insomnia due to other mental disorder: Secondary | ICD-10-CM | POA: Diagnosis not present

## 2022-07-15 DIAGNOSIS — J3089 Other allergic rhinitis: Secondary | ICD-10-CM | POA: Diagnosis not present

## 2022-07-15 DIAGNOSIS — J301 Allergic rhinitis due to pollen: Secondary | ICD-10-CM | POA: Diagnosis not present

## 2022-07-28 DIAGNOSIS — J301 Allergic rhinitis due to pollen: Secondary | ICD-10-CM | POA: Diagnosis not present

## 2022-07-28 DIAGNOSIS — J3081 Allergic rhinitis due to animal (cat) (dog) hair and dander: Secondary | ICD-10-CM | POA: Diagnosis not present

## 2022-07-28 DIAGNOSIS — J3089 Other allergic rhinitis: Secondary | ICD-10-CM | POA: Diagnosis not present

## 2022-07-29 ENCOUNTER — Telehealth: Payer: Self-pay | Admitting: Gastroenterology

## 2022-07-29 ENCOUNTER — Other Ambulatory Visit: Payer: Self-pay

## 2022-07-29 NOTE — Telephone Encounter (Signed)
Patient has questions about colonoscopy medication. Requesting call back.

## 2022-07-29 NOTE — Progress Notes (Signed)
Patient's call has been returned.  He has his rx for colonoscopy prep.  He has been advised that he can take his losartan bp med with just a sip of water, and take his Zoloft after his procedure-it does not need to be stopped in advance.  Thanks,  [place], [place]

## 2022-07-30 DIAGNOSIS — F320 Major depressive disorder, single episode, mild: Secondary | ICD-10-CM | POA: Diagnosis not present

## 2022-07-30 DIAGNOSIS — F409 Phobic anxiety disorder, unspecified: Secondary | ICD-10-CM | POA: Diagnosis not present

## 2022-08-01 ENCOUNTER — Encounter: Payer: Self-pay | Admitting: Gastroenterology

## 2022-08-02 ENCOUNTER — Ambulatory Visit: Payer: BC Managed Care – PPO | Admitting: Registered Nurse

## 2022-08-02 ENCOUNTER — Ambulatory Visit
Admission: RE | Admit: 2022-08-02 | Discharge: 2022-08-02 | Disposition: A | Discharge status: Home / Self Care | Payer: BC Managed Care – PPO | Attending: Gastroenterology | Admitting: Gastroenterology

## 2022-08-02 ENCOUNTER — Encounter: Payer: Self-pay | Admitting: Gastroenterology

## 2022-08-02 ENCOUNTER — Encounter
Admission: RE | Disposition: A | Discharge status: Home / Self Care | Payer: Self-pay | Source: Home / Self Care | Attending: Gastroenterology

## 2022-08-02 DIAGNOSIS — D123 Benign neoplasm of transverse colon: Secondary | ICD-10-CM | POA: Insufficient documentation

## 2022-08-02 DIAGNOSIS — K5730 Diverticulosis of large intestine without perforation or abscess without bleeding: Secondary | ICD-10-CM | POA: Insufficient documentation

## 2022-08-02 DIAGNOSIS — D125 Benign neoplasm of sigmoid colon: Secondary | ICD-10-CM | POA: Diagnosis not present

## 2022-08-02 DIAGNOSIS — Z1211 Encounter for screening for malignant neoplasm of colon: Secondary | ICD-10-CM | POA: Insufficient documentation

## 2022-08-02 DIAGNOSIS — Z87891 Personal history of nicotine dependence: Secondary | ICD-10-CM | POA: Diagnosis not present

## 2022-08-02 DIAGNOSIS — I10 Essential (primary) hypertension: Secondary | ICD-10-CM | POA: Diagnosis not present

## 2022-08-02 DIAGNOSIS — D124 Benign neoplasm of descending colon: Secondary | ICD-10-CM | POA: Diagnosis not present

## 2022-08-02 DIAGNOSIS — Z86010 Personal history of colonic polyps: Secondary | ICD-10-CM

## 2022-08-02 DIAGNOSIS — D120 Benign neoplasm of cecum: Secondary | ICD-10-CM | POA: Diagnosis not present

## 2022-08-02 DIAGNOSIS — D126 Benign neoplasm of colon, unspecified: Secondary | ICD-10-CM | POA: Diagnosis not present

## 2022-08-02 DIAGNOSIS — D128 Benign neoplasm of rectum: Secondary | ICD-10-CM | POA: Diagnosis not present

## 2022-08-02 DIAGNOSIS — K635 Polyp of colon: Secondary | ICD-10-CM | POA: Diagnosis not present

## 2022-08-02 DIAGNOSIS — Z6841 Body Mass Index (BMI) 40.0 and over, adult: Secondary | ICD-10-CM | POA: Diagnosis not present

## 2022-08-02 HISTORY — PX: COLONOSCOPY WITH PROPOFOL: SHX5780

## 2022-08-02 HISTORY — DX: Essential (primary) hypertension: I10

## 2022-08-02 SURGERY — COLONOSCOPY WITH PROPOFOL
Anesthesia: General

## 2022-08-02 MED ORDER — SIMETHICONE 40 MG/0.6ML PO SUSP
ORAL | Status: DC | PRN
  Administered 2022-08-02: 120 mL

## 2022-08-02 MED ORDER — PROPOFOL 500 MG/50ML IV EMUL
INTRAVENOUS | Status: DC | PRN
  Administered 2022-08-02: 186.722 ug/kg/min via INTRAVENOUS

## 2022-08-02 MED ORDER — GLYCOPYRROLATE 0.2 MG/ML IJ SOLN
INTRAMUSCULAR | Status: AC
  Filled 2022-08-02: qty 1

## 2022-08-02 MED ORDER — DEXMEDETOMIDINE HCL IN NACL 80 MCG/20ML IV SOLN
INTRAVENOUS | Status: AC
  Filled 2022-08-02: qty 20

## 2022-08-02 MED ORDER — PROPOFOL 1000 MG/100ML IV EMUL
INTRAVENOUS | Status: AC
  Filled 2022-08-02: qty 100

## 2022-08-02 MED ORDER — LIDOCAINE HCL (PF) 2 % IJ SOLN
INTRAMUSCULAR | Status: AC
  Filled 2022-08-02: qty 35

## 2022-08-02 MED ORDER — PROPOFOL 10 MG/ML IV BOLUS
INTRAVENOUS | Status: DC | PRN
  Administered 2022-08-02: 150 mg via INTRAVENOUS

## 2022-08-02 MED ORDER — SODIUM CHLORIDE 0.9 % IV SOLN: INTRAVENOUS | Status: DC

## 2022-08-02 MED ORDER — PROPOFOL 1000 MG/100ML IV EMUL
INTRAVENOUS | Status: AC
  Filled 2022-08-02: qty 200

## 2022-08-02 MED ORDER — LIDOCAINE HCL (CARDIAC) PF 100 MG/5ML IV SOSY
PREFILLED_SYRINGE | INTRAVENOUS | Status: DC | PRN
  Administered 2022-08-02: 100 mg via INTRAVENOUS

## 2022-08-02 MED ORDER — PHENYLEPHRINE 80 MCG/ML (10ML) SYRINGE FOR IV PUSH (FOR BLOOD PRESSURE SUPPORT)
PREFILLED_SYRINGE | INTRAVENOUS | Status: AC
  Filled 2022-08-02: qty 10

## 2022-08-02 NOTE — Transfer of Care (Signed)
Immediate Anesthesia Transfer of Care Note  Patient: Andrew Andersen.  Procedure(s) Performed: COLONOSCOPY WITH PROPOFOL  Patient Location: Endoscopy Unit  Anesthesia Type:General  Level of Consciousness: drowsy  Airway & Oxygen Therapy: Patient Spontanous Breathing  Post-op Assessment: Report given to RN and Post -op Vital signs reviewed and stable  Post vital signs: Reviewed and stable  Last Vitals:  Vitals Value Taken Time  BP    Temp    Pulse    Resp    SpO2      Last Pain:  Vitals:   08/02/22 0730  TempSrc: Temporal  PainSc: 0-No pain         Complications: No notable events documented.

## 2022-08-02 NOTE — Op Note (Signed)
[facility] Gastroenterology Patient Name: Andrew Andersen Procedure Date: 08/02/2022 7:24 AM MRN: [id number] Account #: [id number] Date of Birth: 1975-08-06 Admit Type: Outpatient Age: 47 Room: [facility] ENDO ROOM 4 Gender: Male Note Status: Finalized Instrument Name: Jasper Riling [id number] Procedure:             Colonoscopy Indications:           High risk colon cancer surveillance: Personal history                         of colonic polyps Providers:             Lucilla Lame MD, MD Referring MD:          No Local Md, MD (Referring MD) Medicines:             Propofol per Anesthesia Complications:         No immediate complications. Procedure:             Pre-Anesthesia Assessment:                        - Prior to the procedure, a History and Physical was                         performed, and patient medications and allergies were                         reviewed. The patient's tolerance of previous                         anesthesia was also reviewed. The risks and benefits                         of the procedure and the sedation options and risks                         were discussed with the patient. All questions were                         answered, and informed consent was obtained. Prior                         Anticoagulants: The patient has taken no anticoagulant                         or antiplatelet agents. ASA Grade Assessment: II - A                         patient with mild systemic disease. After reviewing                         the risks and benefits, the patient was deemed in                         satisfactory condition to undergo the procedure.                        After obtaining informed consent, the colonoscope was  passed under direct vision. Throughout the procedure,                         the patient's blood pressure, pulse, and oxygen                         saturations were monitored continuously. The                          Colonoscope was introduced through the anus and                         advanced to the the cecum, identified by appendiceal                         orifice and ileocecal valve. The colonoscopy was                         performed without difficulty. The patient tolerated                         the procedure well. The quality of the bowel                         preparation was good. Findings:      The perianal and digital rectal examinations were normal.      An 8 mm polyp was found in the sigmoid colon. The polyp was       pedunculated. The polyp was removed with a hot snare. Resection and       retrieval were complete.      Three sessile polyps were found in the transverse colon. The polyps were       4 to 7 mm in size. These polyps were removed with a cold snare.       Resection and retrieval were complete.      A 6 mm polyp was found in the descending colon. The polyp was sessile.       The polyp was removed with a cold snare. Resection and retrieval were       complete.      A 5 mm polyp was found in the cecum. The polyp was sessile. The polyp       was removed with a cold snare. Resection and retrieval were complete.      A 4 mm polyp was found in the sigmoid colon. The polyp was sessile. The       polyp was removed with a cold snare. Resection and retrieval were       complete.      Three sessile polyps were found in the rectum. The polyps were 3 to 4 mm       in size. These polyps were removed with a cold snare. Resection and       retrieval were complete.      Multiple small-mouthed diverticula were found in the sigmoid colon. Impression:            - One 8 mm polyp in the sigmoid colon, removed with a                         hot snare. Resected and retrieved.                        -  Three 4 to 7 mm polyps in the transverse colon,                         removed with a cold snare. Resected and retrieved.                        - One 6 mm polyp in the descending  colon, removed with                         a cold snare. Resected and retrieved.                        - One 5 mm polyp in the cecum, removed with a cold                         snare. Resected and retrieved.                        - One 4 mm polyp in the sigmoid colon, removed with a                         cold snare. Resected and retrieved.                        - Three 3 to 4 mm polyps in the rectum, removed with a                         cold snare. Resected and retrieved.                        - Diverticulosis in the sigmoid colon. Recommendation:        - Discharge patient to home.                        - Resume previous diet.                        - Continue present medications.                        - Await pathology results.                        - Repeat colonoscopy in 3 years for surveillance. Procedure Code(s):     --- Professional ---                        [phone], Colonoscopy, flexible; with removal of                         tumor(s), polyp(s), or other lesion(s) by snare                         technique Diagnosis Code(s):     --- Professional ---                        Z86.010, Personal history of colonic polyps  D12.4, Benign neoplasm of descending colon                        D12.0, Benign neoplasm of cecum                        D12.3, Benign neoplasm of transverse colon (hepatic                         flexure or splenic flexure) CPT copyright 2022 [employer]. All rights reserved. The codes documented in this report are preliminary and upon coder review may  be revised to meet current compliance requirements. Lucilla Lame MD, MD 08/02/2022 8:35:49 AM This report has been signed electronically. Number of Addenda: 0 Note Initiated On: 08/02/2022 7:24 AM Scope Withdrawal Time: 0 hours 10 minutes 56 seconds  Total Procedure Duration: 0 hours 23 minutes 37 seconds  Estimated Blood Loss:  Estimated blood loss: none.       [facility]

## 2022-08-02 NOTE — Anesthesia Preprocedure Evaluation (Signed)
Anesthesia Evaluation  Patient identified by MRN, date of birth, ID band Patient awake    Reviewed: Allergy & Precautions, NPO status , Patient's Chart, lab work & pertinent test results  History of Anesthesia Complications Negative for: history of anesthetic complications  Airway Mallampati: III  TM Distance: >3 FB Neck ROM: full    Dental  (+) Chipped   Pulmonary neg shortness of breath, former smoker   Pulmonary exam normal        Cardiovascular Exercise Tolerance: Good hypertension, (-) angina Normal cardiovascular exam     Neuro/Psych  PSYCHIATRIC DISORDERS      negative neurological ROS     GI/Hepatic negative GI ROS, Neg liver ROS,neg GERD  ,,  Endo/Other  negative endocrine ROS    Renal/GU negative Renal ROS  negative genitourinary   Musculoskeletal   Abdominal   Peds  Hematology negative hematology ROS (+)   Anesthesia Other Findings Past Medical History: No date: Allergy No date: Hypertension  Past Surgical History: 08/09/2019: COLONOSCOPY WITH PROPOFOL; N/A     Comment:  Procedure: COLONOSCOPY WITH PROPOFOL;  Surgeon: Lucilla Lame, MD;  Location: [facility] ENDOSCOPY;  Service:               Endoscopy;  Laterality: N/A; No date: KNEE SURGERY; Left  BMI    Body Mass Index: 40.87 kg/m      Reproductive/Obstetrics negative OB ROS                             Anesthesia Physical Anesthesia Plan  ASA: 3  Anesthesia Plan: General   Post-op Pain Management:    Induction: Intravenous  PONV Risk Score and Plan: Propofol infusion and TIVA  Airway Management Planned: Natural Airway and Nasal Cannula  Additional Equipment:   Intra-op Plan:   Post-operative Plan:   Informed Consent: I have reviewed the patients History and Physical, chart, labs and discussed the procedure including the risks, benefits and alternatives for the proposed anesthesia with the  patient or authorized representative who has indicated his/her understanding and acceptance.     Dental Advisory Given  Plan Discussed with: Anesthesiologist, CRNA and Surgeon  Anesthesia Plan Comments: (Patient consented for risks of anesthesia including but not limited to:  - adverse reactions to medications - risk of airway placement if required - damage to eyes, teeth, lips or other oral mucosa - nerve damage due to positioning  - sore throat or hoarseness - Damage to heart, brain, nerves, lungs, other parts of body or loss of life  Patient voiced understanding.)       Anesthesia Quick Evaluation

## 2022-08-02 NOTE — Anesthesia Postprocedure Evaluation (Signed)
Anesthesia Post Note  Patient: Andrew Andersen.  Procedure(s) Performed: COLONOSCOPY WITH PROPOFOL  Patient location during evaluation: Endoscopy Anesthesia Type: General Level of consciousness: awake and alert Pain management: pain level controlled Vital Signs Assessment: post-procedure vital signs reviewed and stable Respiratory status: spontaneous breathing, nonlabored ventilation, respiratory function stable and patient connected to nasal cannula oxygen Cardiovascular status: blood pressure returned to baseline and stable Postop Assessment: no apparent nausea or vomiting Anesthetic complications: no   No notable events documented.   Last Vitals:  Vitals:   08/02/22 0847 08/02/22 0857  BP: 114/75 123/82  Pulse: 67 66  Resp: 17 15  Temp:    SpO2: 95% 95%    Last Pain:  Vitals:   08/02/22 0847  TempSrc:   PainSc: Asleep                 Precious Haws Meryl Hubers

## 2022-08-02 NOTE — H&P (Signed)
Lucilla Lame, MD [address] [place], [place] [address] Phone:[phone] Fax : [phone]  Primary Care Physician:  Eulis Foster, MD Primary Gastroenterologist:  Dr. Allen Norris  Pre-Procedure History & Physical: HPI:  Andrew Andersen. is a 47 y.o. male is here for an colonoscopy.   Past Medical History:  Diagnosis Date   Allergy    Hypertension     Past Surgical History:  Procedure Laterality Date   COLONOSCOPY WITH PROPOFOL N/A 08/09/2019   Procedure: COLONOSCOPY WITH PROPOFOL;  Surgeon: Lucilla Lame, MD;  Location: [facility] ENDOSCOPY;  Service: Endoscopy;  Laterality: N/A;   KNEE SURGERY Left     Prior to Admission medications   Medication Sig Start Date End Date Taking? Authorizing Provider  losartan (COZAAR) 25 MG tablet Take 1 tablet (25 mg total) by mouth daily. 06/29/22  Yes Myles Gip, DO  sertraline (ZOLOFT) 50 MG tablet Take 50 mg by mouth every morning. 07/13/22  Yes [provider]  EPINEPHrine 0.3 mg/0.3 mL IJ SOAJ injection epinephrine 0.3 mg/0.3 mL injection, auto-injector    [provider]    Allergies as of 06/03/2022   (No Known Allergies)    Family History  Problem Relation Age of Onset   Hypertension Mother    Heart disease Maternal Grandmother    Heart attack Maternal Grandfather    Dementia Paternal Grandmother    Alzheimer's disease Paternal Grandmother     Social History   Socioeconomic History   Marital status: Single    Spouse name: Not on file   Number of children: Not on file   Years of education: Not on file   Highest education level: Not on file  Occupational History   Not on file  Tobacco Use   Smoking status: Former    Packs/day: 1.00    Years: 10.00    Total pack years: 10.00    Types: Cigarettes    Quit date: 07/24/2012    Years since quitting: 10.0    Passive exposure: Past   Smokeless tobacco: Former    Types: Snuff    Quit date: 01/13/2015  Vaping Use   Vaping  Use: Never used  Substance and Sexual Activity   Alcohol use: Yes    Comment: occassionally   Drug use: No   Sexual activity: Not on file  Other Topics Concern   Not on file  Social History Narrative   Not on file   Social Determinants of Health   Financial Resource Strain: Not on file  Food Insecurity: Not on file  Transportation Needs: Not on file  Physical Activity: Not on file  Stress: Not on file  Social Connections: Not on file  Intimate Partner Violence: Not on file    Review of Systems: See HPI, otherwise negative ROS  Physical Exam: BP (!) 135/94   Pulse 91   Temp (!) 97 F (36.1 C) (Temporal)   Resp 16   Ht '6\' 8"'$  (2.032 m)   Wt (!) 168.7 kg   SpO2 97%   BMI 40.87 kg/m  General:   Alert,  pleasant and cooperative in NAD Head:  Normocephalic and atraumatic. Neck:  Supple; no masses or thyromegaly. Lungs:  Clear throughout to auscultation.    Heart:  Regular rate and rhythm. Abdomen:  Soft, nontender and nondistended. Normal bowel sounds, without guarding, and without rebound.   Neurologic:  Alert and  oriented x4;  grossly normal neurologically.  Impression/Plan: [name]. is here for an colonoscopy to  be performed for a history of adenomatous polyps on 2021    Risks, benefits, limitations, and alternatives regarding  colonoscopy have been reviewed with the patient.  Questions have been answered.  All parties agreeable.   Lucilla Lame, MD  08/02/2022, 8:00 AM

## 2022-08-03 ENCOUNTER — Encounter: Payer: Self-pay | Admitting: Gastroenterology

## 2022-08-03 LAB — SURGICAL PATHOLOGY

## 2022-08-15 DIAGNOSIS — D225 Melanocytic nevi of trunk: Secondary | ICD-10-CM | POA: Diagnosis not present

## 2022-08-15 DIAGNOSIS — D235 Other benign neoplasm of skin of trunk: Secondary | ICD-10-CM | POA: Diagnosis not present

## 2022-08-16 DIAGNOSIS — J301 Allergic rhinitis due to pollen: Secondary | ICD-10-CM | POA: Diagnosis not present

## 2022-08-16 DIAGNOSIS — J3089 Other allergic rhinitis: Secondary | ICD-10-CM | POA: Diagnosis not present

## 2022-08-18 ENCOUNTER — Encounter: Payer: Self-pay | Admitting: Gastroenterology

## 2022-08-18 ENCOUNTER — Ambulatory Visit: Payer: BC Managed Care – PPO | Admitting: Gastroenterology

## 2022-08-18 VITALS — BP 162/99 | HR 98 | Temp 98.2°F | Wt 382.0 lb

## 2022-08-18 DIAGNOSIS — Z86010 Personal history of colonic polyps: Secondary | ICD-10-CM

## 2022-08-18 NOTE — Progress Notes (Signed)
Primary Care Physician: Eulis Foster, MD  Primary Gastroenterologist:  Dr. Lucilla Lame  Chief Complaint  Patient presents with   Follow-up    Pathology results    HPI: Andrew Andersen. is a 47 y.o. male here after having a colonoscopy for screening purposes.  At the time of colonoscopy the patient had multiple polyps with the pathology showing:  DIAGNOSIS: A.  COLON POLYP, SIGMOID; HOT SNARE: - TUBULAR ADENOMA WITH FOCAL HIGH-GRADE DYSPLASIA. - LOW-GRADE ADENOMATOUS CHANGE EXTENDS TO THE CAUTERIZED INKED BASE. - NEGATIVE FOR CARCINOMA.  B.  COLON POLYP X 3, TRANSVERSE; COLD SNARE: - TUBULAR ADENOMA (3). - NEGATIVE FOR HIGH-GRADE DYSPLASIA AND MALIGNANCY.  C.  COLON POLYP, CECUM; COLD SNARE: - TUBULAR ADENOMA. - NEGATIVE FOR HIGH-GRADE DYSPLASIA AND MALIGNANCY.  D.  COLON POLYP, DESCENDING; COLD SNARE: - TUBULAR ADENOMA. - NEGATIVE FOR HIGH-GRADE DYSPLASIA AND MALIGNANCY.  E.  COLON POLYP, SIGMOID; COLD SNARE: - TUBULAR ADENOMA. - INKED BASE IS FREE OF DYSPLASIA. - NEGATIVE FOR HIGH-GRADE DYSPLASIA AND MALIGNANCY.  F.  RECTUM POLYP X 3; COLD SNARE: - TUBULAR ADENOMA (1). - HYPERPLASTIC POLYP (3). - NEGATIVE FOR HIGH-GRADE DYSPLASIA AND MALIGNANCY.  The patient comes in now for follow-up of the pathology.  The patient reports that after the last procedure he had bleeding for about a day and it was much more than he had in the past and was concerned but it went away.  Past Medical History:  Diagnosis Date   Allergy    Hypertension     Current Outpatient Medications  Medication Sig Dispense Refill   EPINEPHrine 0.3 mg/0.3 mL IJ SOAJ injection epinephrine 0.3 mg/0.3 mL injection, auto-injector     losartan (COZAAR) 25 MG tablet Take 1 tablet (25 mg total) by mouth daily. 90 tablet 0   sertraline (ZOLOFT) 50 MG tablet Take 50 mg by mouth every morning.     No current facility-administered medications for this visit.    Allergies as of 08/18/2022    (No Known Allergies)    ROS:  General: Negative for anorexia, weight loss, fever, chills, fatigue, weakness. ENT: Negative for hoarseness, difficulty swallowing , nasal congestion. CV: Negative for chest pain, angina, palpitations, dyspnea on exertion, peripheral edema.  Respiratory: Negative for dyspnea at rest, dyspnea on exertion, cough, sputum, wheezing.  GI: See history of present illness. GU:  Negative for dysuria, hematuria, urinary incontinence, urinary frequency, nocturnal urination.  Endo: Negative for unusual weight change.    Physical Examination:   There were no vitals taken for this visit.  General: Well-nourished, well-developed in no acute distress.  Eyes: No icterus. Conjunctivae pink. Neuro: Alert and oriented x 3.  Grossly intact. Skin: Warm and dry, no jaundice.   Psych: Alert and cooperative, normal mood and affect.  Labs:    Imaging Studies: No results found.  Assessment and Plan:   Andrew Andersen. is a 47 y.o. y/o male seen today after having a colonoscopy with a large polyp in the sigmoid colon with high-grade dysplasia.  The pathology reported that there was some low-grade dysplasia at the cautery margin.  The patient will have a repeat colonoscopy in 3 months to evaluate this area.  The patient has also been told that because of his numerous polyps that his last 2 colonoscopies that he should have genetic testing done.  The patient will be set up for genetic testing.  The patient will also have a repeat colonoscopy in 3 years after we reassess him in 3 months  unless genetic testing dictates different recommendations.  The patient has been explained the plan agrees with it.     Lucilla Lame, MD. Marval Regal    Note: This dictation was prepared with Dragon dictation along with smaller phrase technology. Any transcriptional errors that result from this process are unintentional.

## 2022-08-25 DIAGNOSIS — J3081 Allergic rhinitis due to animal (cat) (dog) hair and dander: Secondary | ICD-10-CM | POA: Diagnosis not present

## 2022-08-25 DIAGNOSIS — J3089 Other allergic rhinitis: Secondary | ICD-10-CM | POA: Diagnosis not present

## 2022-08-25 DIAGNOSIS — J301 Allergic rhinitis due to pollen: Secondary | ICD-10-CM | POA: Diagnosis not present

## 2022-09-01 ENCOUNTER — Other Ambulatory Visit: Payer: Self-pay | Admitting: Family Medicine

## 2022-09-01 NOTE — Telephone Encounter (Signed)
Requested Prescriptions  Pending Prescriptions Disp Refills   losartan (COZAAR) 25 MG tablet [Pharmacy Med Name: LOSARTAN POTASSIUM 25 MG TAB] 90 tablet 0    Sig: TAKE 1 TABLET (25 MG TOTAL) BY MOUTH DAILY.     Cardiovascular:  Angiotensin Receptor Blockers Failed - 09/01/2022 11:46 AM      Failed - Last BP in normal range    BP Readings from Last 1 Encounters:  08/18/22 (!) 162/99         Passed - Cr in normal range and within 180 days    Creat  Date Value Ref Range Status  06/30/2017 1.17 0.60 - 1.35 mg/dL Final   Creatinine, Ser  Date Value Ref Range Status  06/29/2022 1.12 0.76 - 1.27 mg/dL Final         Passed - K in normal range and within 180 days    Potassium  Date Value Ref Range Status  06/29/2022 4.0 3.5 - 5.2 mmol/L Final         Passed - Patient is not pregnant      Passed - Valid encounter within last 6 months    Recent Outpatient Visits           2 months ago Elevated blood pressure reading   [place], DO   3 months ago Sleep apnea in adult   [place] [facility], [place], MD   10 months ago Annual physical exam   [facility] Eulas Post, MD   2 years ago Grandfather Eulas Post, MD   2 years ago Annual physical exam   [facility] Eulas Post, MD

## 2022-09-05 DIAGNOSIS — F320 Major depressive disorder, single episode, mild: Secondary | ICD-10-CM | POA: Diagnosis not present

## 2022-09-05 DIAGNOSIS — F409 Phobic anxiety disorder, unspecified: Secondary | ICD-10-CM | POA: Diagnosis not present

## 2022-09-07 ENCOUNTER — Telehealth: Payer: Self-pay

## 2022-09-07 DIAGNOSIS — Z86010 Personal history of colonic polyps: Secondary | ICD-10-CM

## 2022-09-07 MED ORDER — NA SULFATE-K SULFATE-MG SULF 17.5-3.13-1.6 GM/177ML PO SOLN: 1.0000 | Freq: Once | ORAL | 0 refills | Status: AC

## 2022-09-07 NOTE — Telephone Encounter (Signed)
Repeat colonoscopy scheduled

## 2022-09-07 NOTE — Telephone Encounter (Signed)
-----   Message from Conrad sent at 08/18/2022  1:40 PM EST ----- Regarding: 3-4 mth colon 3-4 mth colon

## 2022-09-19 ENCOUNTER — Ambulatory Visit: Payer: BC Managed Care – PPO | Admitting: Primary Care

## 2022-09-19 DIAGNOSIS — G4733 Obstructive sleep apnea (adult) (pediatric): Secondary | ICD-10-CM

## 2022-09-19 DIAGNOSIS — R0683 Snoring: Secondary | ICD-10-CM

## 2022-09-19 DIAGNOSIS — R0681 Apnea, not elsewhere classified: Secondary | ICD-10-CM

## 2022-09-20 ENCOUNTER — Inpatient Hospital Stay: Payer: BC Managed Care – PPO | Attending: Oncology | Admitting: Licensed Clinical Social Worker

## 2022-09-20 ENCOUNTER — Inpatient Hospital Stay: Payer: BC Managed Care – PPO

## 2022-09-20 ENCOUNTER — Encounter: Payer: Self-pay | Admitting: Licensed Clinical Social Worker

## 2022-09-20 DIAGNOSIS — J3081 Allergic rhinitis due to animal (cat) (dog) hair and dander: Secondary | ICD-10-CM | POA: Diagnosis not present

## 2022-09-20 DIAGNOSIS — Z860100 Personal history of colon polyps, unspecified: Secondary | ICD-10-CM

## 2022-09-20 DIAGNOSIS — Z800 Family history of malignant neoplasm of digestive organs: Secondary | ICD-10-CM

## 2022-09-20 DIAGNOSIS — J3089 Other allergic rhinitis: Secondary | ICD-10-CM | POA: Diagnosis not present

## 2022-09-20 DIAGNOSIS — J301 Allergic rhinitis due to pollen: Secondary | ICD-10-CM | POA: Diagnosis not present

## 2022-09-20 NOTE — Progress Notes (Signed)
REFERRING PROVIDER: Lucilla Lame, MD [address] ,  [place] [address]  PRIMARY PROVIDER:  Eulis Foster, MD  PRIMARY REASON FOR VISIT:  1. Personal history of colonic polyps   2. Family history of colon cancer      HISTORY OF PRESENT ILLNESS:   Mr. Nakamura, a 47 y.o. male, was seen for a [place] cancer genetics consultation at the request of Dr. Allen Norris due to a personal history of colon polyps and family history of colon cancer.  Mr. Mathers presents to clinic today to discuss the possibility of a hereditary predisposition to cancer, genetic testing, and to further clarify his future cancer risks, as well as potential cancer risks for family members.   Mr. Mcdavid had a colonoscopy in 2018 that showed 8 polyps, tubular adenomas.  He had a colonoscopy in 2024 that showed 10 polyps, mix of tubular adenomas and hyperplastic polyps.  CANCER HISTORY:  Mr. Lipsett is a 47 y.o. male with no personal history of cancer.    Past Medical History:  Diagnosis Date   Allergy    Hypertension     Past Surgical History:  Procedure Laterality Date   COLONOSCOPY WITH PROPOFOL N/A 08/09/2019   Procedure: COLONOSCOPY WITH PROPOFOL;  Surgeon: Lucilla Lame, MD;  Location: [facility] ENDOSCOPY;  Service: Endoscopy;  Laterality: N/A;   COLONOSCOPY WITH PROPOFOL N/A 08/02/2022   Procedure: COLONOSCOPY WITH PROPOFOL;  Surgeon: Lucilla Lame, MD;  Location: [facility] ENDOSCOPY;  Service: Endoscopy;  Laterality: N/A;   KNEE SURGERY Left     FAMILY HISTORY:  We obtained a detailed, 4-generation family history.  Significant diagnoses are listed below: Family History  Problem Relation Age of Onset   Hypertension Mother    Colon polyps Mother    Colon polyps Father    Lung cancer Father        stage IV   Heart disease Maternal Grandmother    Heart attack Maternal Grandfather    Dementia Paternal Grandmother    Alzheimer's disease Paternal Grandmother    Colon cancer Paternal Uncle        dx. 1st  time in 84s, 2nd time in his 64s   Colon cancer Paternal Uncle    Mr. Pruet has 1 sister, 15, she has had colon polyps.   Mr. Smigielski mother has had colon polyps. There is colon cancer on this side of the family, patient is unsure which relatives have had it.  Mr. Huizar father has stage IV lung cancer. Two paternal uncles have had colon cancer. One was diagnosed at 45 and again in his 60s. No other known cancers on this side of the family.  Mr. Mcbratney is unaware of previous family history of genetic testing for hereditary cancer risks. There is no reported Ashkenazi Jewish ancestry. There is no known consanguinity.    GENETIC COUNSELING ASSESSMENT: Mr. Houser is a 47 y.o. male with a personal history of colon polyps and family history of colon cancer which is somewhat suggestive of a hereditary cancer syndrome and predisposition to cancer. We, therefore, discussed and recommended the following at today's visit.   DISCUSSION: We discussed that polyps in general are common, however, most people have fewer than 5 lifetime polyps.  When an individual has 10 or more polyps we become concerned about an underlying polyposis syndrome.  The most common hereditary polyposis syndromes are caused by problems in the APC and MUTYH genes. Testing is beneficial for several reasons including knowing how to follow individuals for cancer screenings, and  understand if other family members could be at risk for cancer and allow them to undergo genetic testing.   We reviewed the characteristics, features and inheritance patterns of hereditary cancer syndromes. We also discussed genetic testing, including the appropriate family members to test, the process of testing, insurance coverage and turn-around-time for results. We discussed the implications of a negative, positive and/or variant of uncertain significant result. We recommended Mr. Florene Glen pursue genetic testing for the [facility] Multi-Cancer+RNA gene panel.    Based on Mr. Sparling's personal history of polyps and family history of cancer, he meets medical criteria for genetic testing. Despite that he meets criteria, he may still have an out of pocket cost. We discussed that if his out of pocket cost for testing is over $100, the laboratory will call and confirm whether he wants to proceed with testing.  If the out of pocket cost of testing is less than $100 he will be billed by the genetic testing laboratory.   PLAN: After considering the risks, benefits, and limitations, Mr. Lamorte provided informed consent to pursue genetic testing and the blood sample was sent to [facility] for analysis of the Multi-Cancer+RNA panel. Results should be available within approximately 2-3 weeks' time, at which point they will be disclosed by telephone to Mr. Alpers, as will any additional recommendations warranted by these results. Mr. Vierling will receive a summary of his genetic counseling visit and a copy of his results once available. This information will also be available in Epic.   Mr. Carra questions were answered to his satisfaction today. Our contact information was provided should additional questions or concerns arise. Thank you for the referral and allowing [place] to share in the care of your patient.   Faith Rogue, MS, [facility] Genetic Counselor [email or web address] Phone: [phone]  The patient was seen for a total of 15 minutes in face-to-face genetic counseling.  Dr. Grayland Ormond was available for discussion regarding this case.   _______________________________________________________________________ For Office Staff:  Number of people involved in session: 1 Was an Intern/ student involved with case: no

## 2022-09-22 DIAGNOSIS — F320 Major depressive disorder, single episode, mild: Secondary | ICD-10-CM | POA: Diagnosis not present

## 2022-09-22 DIAGNOSIS — F409 Phobic anxiety disorder, unspecified: Secondary | ICD-10-CM | POA: Diagnosis not present

## 2022-09-22 DIAGNOSIS — F5105 Insomnia due to other mental disorder: Secondary | ICD-10-CM | POA: Diagnosis not present

## 2022-09-26 ENCOUNTER — Telehealth: Payer: Self-pay | Admitting: Pulmonary Disease

## 2022-09-26 DIAGNOSIS — G4733 Obstructive sleep apnea (adult) (pediatric): Secondary | ICD-10-CM | POA: Diagnosis not present

## 2022-09-26 NOTE — Telephone Encounter (Signed)
Call patient  Sleep study result  Date of study: 09/19/2022  Impression: Severe obstructive sleep apnea Moderate oxygen desaturations  Recommendation: DME referral  Recommend CPAP therapy for severe obstructive sleep apnea  Auto titrating CPAP with pressure settings of 5-20 will be appropriate  Encourage weight loss measures  Follow-up in the office 4 to 6 weeks following initiation of treatment

## 2022-09-27 NOTE — Telephone Encounter (Signed)
Attempted to call pt but unable to reach. Left message to return call.  

## 2022-09-27 NOTE — Telephone Encounter (Signed)
Please let patient know sleep study showed severe sleep apnea with oxygen desaturations, he needs virtual visit to review results and treatment options

## 2022-09-28 ENCOUNTER — Ambulatory Visit: Payer: Self-pay | Admitting: Licensed Clinical Social Worker

## 2022-09-28 ENCOUNTER — Encounter: Payer: Self-pay | Admitting: Licensed Clinical Social Worker

## 2022-09-28 ENCOUNTER — Telehealth: Payer: Self-pay | Admitting: Licensed Clinical Social Worker

## 2022-09-28 DIAGNOSIS — Z1379 Encounter for other screening for genetic and chromosomal anomalies: Secondary | ICD-10-CM

## 2022-09-28 NOTE — Progress Notes (Signed)
Please let patient know HST 09/19/22 showed severe OSA, AHI 31/hr. Needs OV to discuss starting CPAP

## 2022-09-28 NOTE — Telephone Encounter (Signed)
Pt has an appt scheduled 3/8. Closing encounter.

## 2022-09-28 NOTE — Progress Notes (Signed)
HPI:   Andrew Andersen was previously seen in the [place] clinic due to a family history of cancer and personal history of colon polyps, and concerns regarding a hereditary predisposition to cancer. Please refer to our prior cancer genetics clinic note for more information regarding our discussion, assessment and recommendations, at the time. Andrew Andersen recent genetic test results were disclosed to him, as were recommendations warranted by these results. These results and recommendations are discussed in more detail below.  CANCER HISTORY:  Oncology History   No history exists.    FAMILY HISTORY:  We obtained a detailed, 4-generation family history.  Significant diagnoses are listed below: Family History  Problem Relation Age of Onset   Hypertension Mother    Colon polyps Mother    Lung cancer Father        stage IV   Colon cancer Paternal Uncle        dx. 1st time in 56s, 2nd time in his 40s   Colon cancer Paternal Uncle    Heart disease Maternal Grandmother    Heart attack Maternal Grandfather    Dementia Paternal Grandmother    Alzheimer's disease Paternal Grandmother    Andrew Andersen has 1 sister, 20, she has had colon polyps.    Andrew Andersen mother has had colon polyps. There is colon cancer on this side of the family, patient is unsure which relatives have had it.   Andrew Andersen father has stage IV lung cancer. Two paternal uncles have had colon cancer. One was diagnosed at 80 and again in his 93s. No other known cancers on this side of the family.   Andrew Andersen is unaware of previous family history of genetic testing for hereditary cancer risks. There is no reported Ashkenazi Jewish ancestry. There is no known consanguinity.      GENETIC TEST RESULTS:  The [facility] Multi-Cancer+RNA Panel found no pathogenic mutations.   The Multi-Cancer + RNA Panel offered by [facility] includes sequencing and/or deletion/duplication analysis of the following 70 genes:  AIP*,  ALK, APC*, ATM*, AXIN2*, BAP1*, BARD1*, BLM*, BMPR1A*, BRCA1*, BRCA2*, BRIP1*, CDC73*, CDH1*, CDK4, CDKN1B*, CDKN2A, CHEK2*, CTNNA1*, DICER1*, EPCAM, EGFR, FH*, FLCN*, GREM1, HOXB13, KIT, LZTR1, MAX*, MBD4, MEN1*, MET, MITF, MLH1*, MSH2*, MSH3*, MSH6*, MUTYH*, NF1*, NF2*, NTHL1*, PALB2*, PDGFRA, PMS2*, POLD1*, POLE*, POT1*, PRKAR1A*, PTCH1*, PTEN*, RAD51C*, RAD51D*, RB1*, RET, SDHA*, SDHAF2*, SDHB*, SDHC*, SDHD*, SMAD4*, SMARCA4*, SMARCB1*, SMARCE1*, STK11*, SUFU*, TMEM127*, TP53*, TSC1*, TSC2*, VHL*. RNA analysis is performed for * genes.   The test report has been scanned into EPIC and is located under the Molecular Pathology section of the Results Review tab.  A portion of the result report is included below for reference. Genetic testing reported out on 09/27/2022.      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  At this time, it is unknown if this variant is associated with an increased risk for cancer or if it is benign, but most uncertain variants are reclassified to benign. It should not be used to make medical management decisions. With time, we suspect the laboratory will determine the significance of this variant, if any. If the laboratory reclassifies this variant, we will attempt to contact Andrew Andersen to discuss it further.   Even though a pathogenic variant was not identified, possible explanations for the cancer in the family may include: There may be no hereditary risk for cancer in the family. The cancers in Andrew Andersen and/or his family  may be sporadic/familial or due to other genetic and environmental factors. There may be a gene mutation in one of these genes that current testing methods cannot detect but that chance is small. There could be another gene that has not yet been discovered, or that we have not yet tested, that is responsible for the cancer diagnoses in the family.  It is also possible there is a hereditary cause  for the cancer in the family that Andrew Andersen did not inherit. The variant of uncertain significance detected in the POLD1 gene may be reclassified as a pathogenic variant in the future. At this time, we do not know if this variant increases the risk for cancer.  Therefore, it is important to remain in touch with cancer genetics in the future so that we can continue to offer Andrew Andersen the most up to date genetic testing.   ADDITIONAL GENETIC TESTING:  We discussed with Andrew Andersen that his genetic testing was fairly extensive.  If there are additional relevant genes identified to increase cancer risk that can be analyzed in the future, we would be happy to discuss and coordinate this testing at that time.    CANCER SCREENING RECOMMENDATIONS:  Andrew Andersen test result is considered negative (normal).  This means that we have not identified a hereditary cause for his family history of cancer/personal history of polyps at this time.   An individual's cancer risk and medical management are not determined by genetic test results alone. Overall cancer risk assessment incorporates additional factors, including personal medical history, family history, and any available genetic information that may result in a personalized plan for cancer prevention and surveillance. Therefore, it is recommended he continue to follow the cancer management and screening guidelines provided by his primary healthcare provider.  Colon Cancer Screening: This negative genetic test simply tells [place] that we cannot yet define why Andrew Andersen has had  an increased number of colorectal polyps. Andrew Andersen's medical management and screening should be based on the prospect that he  will likely form more colon polyps and should, therefore, undergo more frequent colonoscopy screening at intervals determined by his GI providers.  We also recommended that Andrew Andersen have an upper endoscopy periodically.  RECOMMENDATIONS FOR FAMILY MEMBERS:    Individuals in this family might be at some increased risk of developing cancer, over the general population risk, due to the family history of cancer.  Individuals in the family should notify their providers of the family history of cancer. We recommend women in this family have a yearly mammogram beginning at age 12, or 46 years younger than the earliest onset of cancer, an annual clinical breast exam, and perform monthly breast self-exams.  Family members should have colonoscopies by at age 64, or earlier, as recommended by their providers. Other members of the family may still carry a pathogenic variant in one of these genes that Andrew Andersen did not inherit. Based on the family history, we recommend his paternal relatives, especially those who have had cancer, ave genetic counseling and testing. Mr. Beller will let us know if we can be of any assistance in coordinating genetic counseling and/or testing for this family member.   We do not recommend familial testing for the POLD1 variant of uncertain significance (VUS).  FOLLOW-UP:  Lastly, we discussed with Mr. Malveaux that cancer genetics is a rapidly advancing field and it is possible that new genetic tests will be appropriate for him and/or his family members in the future. We  encouraged him to remain in contact with cancer genetics on an annual basis so we can update his personal and family histories and let him know of advances in cancer genetics that may benefit this family.   Our contact number was provided. Mr. Wissman questions were answered to his satisfaction, and he knows he is welcome to call us at anytime with additional questions or concerns.    Faith Rogue, MS, [facility] Genetic Counselor [email or web address] Phone: [phone]

## 2022-09-28 NOTE — Telephone Encounter (Signed)
I contacted Mr. Vardeman to discuss his genetic testing results. No pathogenic variants were identified in the 70 genes analyzed. Detailed clinic note to follow.   The test report has been scanned into EPIC and is located under the Molecular Pathology section of the Results Review tab.  A portion of the result report is included below for reference.      Faith Rogue, MS, [facility] Genetic Counselor [email or web address] Phone: [phone]

## 2022-09-30 ENCOUNTER — Telehealth (INDEPENDENT_AMBULATORY_CARE_PROVIDER_SITE_OTHER): Payer: BC Managed Care – PPO | Admitting: Primary Care

## 2022-09-30 DIAGNOSIS — R0683 Snoring: Secondary | ICD-10-CM | POA: Diagnosis not present

## 2022-09-30 NOTE — Progress Notes (Signed)
Virtual Visit via Video Note  I connected with Andrew Andersen. on 09/30/22 at  1:30 PM EST by a video enabled telemedicine application and verified that I am speaking with the correct person using two identifiers.  Location: Patient: Home Provider: Office   I discussed the limitations of evaluation and management by telemedicine and the availability of in person appointments. The patient expressed understanding and agreed to proceed.  History of Present Illness: 47 year old male, former smoker quit in 2013 (10-pack-year history).  Past medical history significant for elevated blood pressure, generalized anxiety, allergic rhinitis, sleep apnea.  Previous [facility] pulmonary encounter: 06/15/2022 Patient presents today for sleep consult. He has symptoms of snoring which will at times wake him up. He feels tired throughout the day. He has anxiety while driving. He will not fall asleep during the daytime unless sitting still and reading. He was told by anesthesiology when he had colonoscopy 3 years ago that he should be elevated for sleep apnea. Typical bedtimes is between 1am-3am. It takes 30-60 mins to fall asleep. He wakes up several times a night. He starts his day at 10am. Weight is some 10-15 lbs. No previous sleep study. He does not wear CPAP or oxygen. [facility] 4/24. Denies symptoms of narcolepsy, cataplexy or sleep walking.   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 Do you currently wear CPAP- no Do you current wear oxygen- no [facility]- 4  09/30/2022 - Interim hx  Patient contacted today to review home sleep study results. He was seen for sleep consult due to snoring symptoms. HST on 09/19/22 showed evidence of severe obstructive sleep apnea, average AHI 31/h with  SpO2 low 81% (average 91%).  Due to severity of his sleep apnea recommending patient be started on auto CPAP.  Patient is in agreement with plan.  We discussed expectations of wearing CPAP nightly.  He will need to follow-up in 31 to 90 days for compliance check.    Observations/Objective:  Appears well without overt respiratory symptoms  Assessment and Plan:  Severe OSA -Patient has symptoms of snoring, waking up choking or gasping for air and daytime sleepiness -HST 09/19/2022>> AHI 31/hour with SpO2 low 81% (average 91%) -Recommending patient be started on CPAP due to severity of OSA, patient agreement with plan -DME order placed for new CPAP start auto settings 5 to 20 cm H2O with mask of choice -Advised patient aim to wear CPAP nightly for 4 to 6 hours or longer -Encourage weight loss efforts  Follow Up Instructions:  -31 to 90 days after CPAP start for compliance check  I discussed the assessment and treatment plan with the patient. The patient was provided an opportunity to ask questions and all were answered. The patient agreed with the plan and demonstrated an understanding of the instructions.   The patient was advised to call back or seek an in-person evaluation if the symptoms worsen or if the condition fails to improve as anticipated.  I provided 22 minutes of non-face-to-face time during this encounter.   Martyn Ehrich, NP

## 2022-09-30 NOTE — Patient Instructions (Signed)
Home sleep study showed severe obstructive sleep apnea, an average of 31 apneas an hour Recommending auto CPAP Aim to wear CPAP nightly 4 to 6 hours or longer Work on weight loss efforts as able Do not drive experiencing excessive daytime sleepiness fatigue  Follow-up 31 to 90 days after CPAP start for compliance check   CPAP and BIPAP Information CPAP and BIPAP are methods that use air pressure to keep your airways open and to help you breathe well. CPAP and BIPAP use different amounts of pressure. Your health care provider will tell you whether CPAP or BIPAP would be more helpful for you. CPAP stands for "continuous positive airway pressure." With CPAP, the amount of pressure stays the same while you breathe in (inhale) and out (exhale). BIPAP stands for "bi-level positive airway pressure." With BIPAP, the amount of pressure will be higher when you inhale and lower when you exhale. This allows you to take larger breaths. CPAP or BIPAP may be used in the hospital, or your health care provider may want you to use it at home. You may need to have a sleep study before your health care provider can order a machine for you to use at home. What are the advantages? CPAP or BIPAP can be helpful if you have: Sleep apnea. Chronic obstructive pulmonary disease (COPD). Heart failure. Medical conditions that cause muscle weakness, including muscular dystrophy or amyotrophic lateral sclerosis (ALS). Other problems that cause breathing to be shallow, weak, abnormal, or difficult. CPAP and BIPAP are most commonly used for obstructive sleep apnea (OSA) to keep the airways from collapsing when the muscles relax during sleep. What are the risks? Generally, this is a safe treatment. However, problems may occur, including: Irritated skin or skin sores if the mask does not fit properly. Dry or stuffy nose or nosebleeds. Dry mouth. Feeling gassy or bloated. Sinus or lung infection if the equipment is not  cleaned properly. When should CPAP or BIPAP be used? In most cases, the mask only needs to be worn during sleep. Generally, the mask needs to be worn throughout the night and during any daytime naps. People with certain medical conditions may also need to wear the mask at other times, such as when they are awake. Follow instructions from your health care provider about when to use the machine. What happens during CPAP or BIPAP?  Both CPAP and BIPAP are provided by a small machine with a flexible plastic tube that attaches to a plastic mask that you wear. Air is blown through the mask into your nose or mouth. The amount of pressure that is used to blow the air can be adjusted on the machine. Your health care provider will set the pressure setting and help you find the best mask for you. Tips for using the mask Because the mask needs to be snug, some people feel trapped or closed-in (claustrophobic) when first using the mask. If you feel this way, you may need to get used to the mask. One way to do this is to hold the mask loosely over your nose or mouth and then gradually apply the mask more snugly. You can also gradually increase the amount of time that you use the mask. Masks are available in various types and sizes. If your mask does not fit well, talk with your health care provider about getting a different one. Some common types of masks include: Full face masks, which fit over the mouth and nose. Nasal masks, which fit over the nose.  Nasal pillow or prong masks, which fit into the nostrils. If you are using a mask that fits over your nose and you tend to breathe through your mouth, a chin strap may be applied to help keep your mouth closed. Use a skin barrier to protect your skin as told by your health care provider. Some CPAP and BIPAP machines have alarms that may sound if the mask comes off or develops a leak. If you have trouble with the mask, it is very important that you talk with your  health care provider about finding a way to make the mask easier to tolerate. Do not stop using the mask. There could be a negative impact on your health if you stop using the mask. Tips for using the machine Place your CPAP or BIPAP machine on a secure table or stand near an electrical outlet. Know where the on/off switch is on the machine. Follow instructions from your health care provider about how to set the pressure on your machine and when you should use it. Do not eat or drink while the CPAP or BIPAP machine is on. Food or fluids could get pushed into your lungs by the pressure of the CPAP or BIPAP. For home use, CPAP and BIPAP machines can be rented or purchased through home health care companies. Many different brands of machines are available. Renting a machine before purchasing may help you find out which particular machine works well for you. Your health insurance company may also decide which machine you may get. Keep the CPAP or BIPAP machine and attachments clean. Ask your health care provider for specific instructions. Check the humidifier if you have a dry stuffy nose or nosebleeds. Make sure it is working correctly. Follow these instructions at home: Take over-the-counter and prescription medicines only as told by your health care provider. Ask if you can take sinus medicine if your sinuses are blocked. Do not use any products that contain nicotine or tobacco. These products include cigarettes, chewing tobacco, and vaping devices, such as e-cigarettes. If you need help quitting, ask your health care provider. Keep all follow-up visits. This is important. Contact a health care provider if: You have redness or pressure sores on your head, face, mouth, or nose from the mask or head gear. You have trouble using the CPAP or BIPAP machine. You cannot tolerate wearing the CPAP or BIPAP mask. Someone tells you that you snore even when wearing your CPAP or BIPAP. Get help right away  if: You have trouble breathing. You feel confused. Summary CPAP and BIPAP are methods that use air pressure to keep your airways open and to help you breathe well. If you have trouble with the mask, it is very important that you talk with your health care provider about finding a way to make the mask easier to tolerate. Do not stop using the mask. There could be a negative impact to your health if you stop using the mask. Follow instructions from your health care provider about when to use the machine. This information is not intended to replace advice given to you by your health care provider. Make sure you discuss any questions you have with your health care provider. Document Revised: 02/17/2021 Document Reviewed: 06/19/2020 Elsevier Patient Education  [place].

## 2022-10-03 DIAGNOSIS — F5105 Insomnia due to other mental disorder: Secondary | ICD-10-CM | POA: Diagnosis not present

## 2022-10-03 DIAGNOSIS — F409 Phobic anxiety disorder, unspecified: Secondary | ICD-10-CM | POA: Diagnosis not present

## 2022-10-03 DIAGNOSIS — F320 Major depressive disorder, single episode, mild: Secondary | ICD-10-CM | POA: Diagnosis not present

## 2022-10-04 DIAGNOSIS — J3081 Allergic rhinitis due to animal (cat) (dog) hair and dander: Secondary | ICD-10-CM | POA: Diagnosis not present

## 2022-10-04 DIAGNOSIS — J301 Allergic rhinitis due to pollen: Secondary | ICD-10-CM | POA: Diagnosis not present

## 2022-10-04 DIAGNOSIS — J3089 Other allergic rhinitis: Secondary | ICD-10-CM | POA: Diagnosis not present

## 2022-10-20 DIAGNOSIS — G4733 Obstructive sleep apnea (adult) (pediatric): Secondary | ICD-10-CM | POA: Diagnosis not present

## 2022-11-03 ENCOUNTER — Encounter: Payer: BC Managed Care – PPO | Admitting: Family Medicine

## 2022-11-04 DIAGNOSIS — J3081 Allergic rhinitis due to animal (cat) (dog) hair and dander: Secondary | ICD-10-CM | POA: Diagnosis not present

## 2022-11-04 DIAGNOSIS — J3089 Other allergic rhinitis: Secondary | ICD-10-CM | POA: Diagnosis not present

## 2022-11-04 DIAGNOSIS — J301 Allergic rhinitis due to pollen: Secondary | ICD-10-CM | POA: Diagnosis not present

## 2022-11-09 ENCOUNTER — Encounter: Payer: Self-pay | Admitting: Gastroenterology

## 2022-11-10 ENCOUNTER — Encounter: Payer: Self-pay | Admitting: Gastroenterology

## 2022-11-10 ENCOUNTER — Ambulatory Visit: Payer: BC Managed Care – PPO | Admitting: Anesthesiology

## 2022-11-10 ENCOUNTER — Other Ambulatory Visit: Payer: Self-pay

## 2022-11-10 ENCOUNTER — Ambulatory Visit
Admission: RE | Admit: 2022-11-10 | Discharge: 2022-11-10 | Disposition: A | Discharge status: Home / Self Care | Payer: BC Managed Care – PPO | Attending: Gastroenterology | Admitting: Gastroenterology

## 2022-11-10 ENCOUNTER — Encounter
Admission: RE | Disposition: A | Discharge status: Home / Self Care | Payer: Self-pay | Source: Home / Self Care | Attending: Gastroenterology

## 2022-11-10 DIAGNOSIS — K5730 Diverticulosis of large intestine without perforation or abscess without bleeding: Secondary | ICD-10-CM | POA: Insufficient documentation

## 2022-11-10 DIAGNOSIS — Z86010 Personal history of colonic polyps: Secondary | ICD-10-CM | POA: Insufficient documentation

## 2022-11-10 DIAGNOSIS — I10 Essential (primary) hypertension: Secondary | ICD-10-CM | POA: Diagnosis not present

## 2022-11-10 DIAGNOSIS — K641 Second degree hemorrhoids: Secondary | ICD-10-CM | POA: Insufficient documentation

## 2022-11-10 DIAGNOSIS — Z09 Encounter for follow-up examination after completed treatment for conditions other than malignant neoplasm: Secondary | ICD-10-CM | POA: Insufficient documentation

## 2022-11-10 DIAGNOSIS — D125 Benign neoplasm of sigmoid colon: Secondary | ICD-10-CM | POA: Diagnosis not present

## 2022-11-10 DIAGNOSIS — Z87891 Personal history of nicotine dependence: Secondary | ICD-10-CM | POA: Diagnosis not present

## 2022-11-10 DIAGNOSIS — F32A Depression, unspecified: Secondary | ICD-10-CM | POA: Diagnosis not present

## 2022-11-10 DIAGNOSIS — K635 Polyp of colon: Secondary | ICD-10-CM | POA: Diagnosis not present

## 2022-11-10 HISTORY — DX: Other seasonal allergic rhinitis: J30.2

## 2022-11-10 HISTORY — DX: Depression, unspecified: F32.A

## 2022-11-10 HISTORY — PX: COLONOSCOPY WITH PROPOFOL: SHX5780

## 2022-11-10 SURGERY — COLONOSCOPY WITH PROPOFOL
Anesthesia: General

## 2022-11-10 MED ORDER — PROPOFOL 500 MG/50ML IV EMUL
INTRAVENOUS | Status: DC | PRN
  Administered 2022-11-10: 140 ug/kg/min via INTRAVENOUS

## 2022-11-10 MED ORDER — SODIUM CHLORIDE 0.9 % IV SOLN: INTRAVENOUS | Status: DC

## 2022-11-10 MED ORDER — LIDOCAINE HCL (CARDIAC) PF 100 MG/5ML IV SOSY
PREFILLED_SYRINGE | INTRAVENOUS | Status: DC | PRN
  Administered 2022-11-10: 60 mg via INTRAVENOUS

## 2022-11-10 MED ORDER — PROPOFOL 10 MG/ML IV BOLUS
INTRAVENOUS | Status: DC | PRN
  Administered 2022-11-10: 30 mg via INTRAVENOUS
  Administered 2022-11-10: 70 mg via INTRAVENOUS

## 2022-11-10 NOTE — Anesthesia Postprocedure Evaluation (Signed)
Anesthesia Post Note  Patient: Landon Bassford.  Procedure(s) Performed: COLONOSCOPY WITH PROPOFOL  Patient location during evaluation: Endoscopy Anesthesia Type: General Level of consciousness: awake and alert Pain management: pain level controlled Vital Signs Assessment: post-procedure vital signs reviewed and stable Respiratory status: spontaneous breathing, nonlabored ventilation, respiratory function stable and patient connected to nasal cannula oxygen Cardiovascular status: blood pressure returned to baseline and stable Postop Assessment: no apparent nausea or vomiting Anesthetic complications: no   No notable events documented.   Last Vitals:  Vitals:   11/10/22 0848 11/10/22 0858  BP: (!) 136/90 (!) 143/95  Pulse: 76   Resp: 16   Temp: (!) 36.4 C   SpO2: 97%     Last Pain:  Vitals:   11/10/22 0908  TempSrc:   PainSc: 0-No pain                 Louie Boston

## 2022-11-10 NOTE — H&P (Signed)
Midge Minium, MD [facility] [address], [address] [place], [place] [address] Phone:[phone] Fax : [phone]  Primary Care Physician:  Ronnald Ramp, MD Primary Gastroenterologist:  Dr. Servando Snare  Pre-Procedure History & Physical: HPI:  Andrew Carmen. is a 47 y.o. male is here for an colonoscopy.   Past Medical History:  Diagnosis Date   Allergy    Depression    Hypertension     Past Surgical History:  Procedure Laterality Date   COLONOSCOPY WITH PROPOFOL N/A 08/09/2019   Procedure: COLONOSCOPY WITH PROPOFOL;  Surgeon: Midge Minium, MD;  Location: [facility] ENDOSCOPY;  Service: Endoscopy;  Laterality: N/A;   COLONOSCOPY WITH PROPOFOL N/A 08/02/2022   Procedure: COLONOSCOPY WITH PROPOFOL;  Surgeon: Midge Minium, MD;  Location[facility] ENDOSCOPY;  Service: Endoscopy;  Laterality: N/A;   KNEE SURGERY Left     Prior to Admission medications   Medication Sig Start Date End Date Taking? Authorizing Provider  losartan (COZAAR) 25 MG tablet TAKE 1 TABLET (25 MG TOTAL) BY MOUTH DAILY. 09/01/22  Yes Simmons-Robinson, Makiera, MD  sertraline (ZOLOFT) 50 MG tablet Take 50 mg by mouth every morning. 07/13/22  Yes [provider]  EPINEPHrine 0.3 mg/0.3 mL IJ SOAJ injection epinephrine 0.3 mg/0.3 mL injection, auto-injector    [provider]    Allergies as of 09/07/2022   (No Known Allergies)    Family History  Problem Relation Age of Onset   Hypertension Mother    Colon polyps Mother    Lung cancer Father        stage IV   Colon cancer Paternal Uncle        dx. 1st time in 32s, 2nd time in his 26s   Colon cancer Paternal Uncle    Heart disease Maternal Grandmother    Heart attack Maternal Grandfather    Dementia Paternal Grandmother    Alzheimer's disease Paternal Grandmother     Social History   Socioeconomic History   Marital status: Single    Spouse name: Not on file   Number of children: Not on file   Years of education: Not on file   Highest  education level: Not on file  Occupational History   Not on file  Tobacco Use   Smoking status: Former    Packs/day: 1.00    Years: 10.00    Additional pack years: 0.00    Total pack years: 10.00    Types: Cigarettes    Quit date: 07/24/2012    Years since quitting: 10.3    Passive exposure: Past   Smokeless tobacco: Former    Types: Snuff    Quit date: 01/13/2015  Vaping Use   Vaping Use: Never used  Substance and Sexual Activity   Alcohol use: Yes    Comment: occassionally,none last 24hrs   Drug use: No   Sexual activity: Not on file  Other Topics Concern   Not on file  Social History Narrative   Not on file   Social Determinants of Health   Financial Resource Strain: Not on file  Food Insecurity: Not on file  Transportation Needs: Not on file  Physical Activity: Not on file  Stress: Not on file  Social Connections: Not on file  Intimate Partner Violence: Not on file    Review of Systems: See HPI, otherwise negative ROS  Physical Exam: There were no vitals taken for this visit. General:   Alert,  pleasant and cooperative in NAD Head:  Normocephalic and atraumatic. Neck:  Supple; no masses  or thyromegaly. Lungs:  Clear throughout to auscultation.    Heart:  Regular rate and rhythm. Abdomen:  Soft, nontender and nondistended. Normal bowel sounds, without guarding, and without rebound.   Neurologic:  Alert and  oriented x4;  grossly normal neurologically.  Impression/Plan: Andrew Andersen. is here for an colonoscopy to be performed for a history of adenomatous polyps on 2024  Risks, benefits, limitations, and alternatives regarding  colonoscopy have been reviewed with the patient.  Questions have been answered.  All parties agreeable.   Midge Minium, MD  11/10/2022, 7:55 AM

## 2022-11-10 NOTE — Transfer of Care (Signed)
Immediate Anesthesia Transfer of Care Note  Patient: Andrew Andersen.  Procedure(s) Performed: COLONOSCOPY WITH PROPOFOL  Patient Location: PACU  Anesthesia Type:General  Level of Consciousness: awake, alert , and oriented  Airway & Oxygen Therapy: Patient Spontanous Breathing  Post-op Assessment: Report given to RN and Post -op Vital signs reviewed and stable  Post vital signs: Reviewed and stable  Last Vitals:  Vitals Value Taken Time  BP 136/90 11/10/22 0848  Temp    Pulse 79 11/10/22 0848  Resp 15 11/10/22 0848  SpO2 97 % 11/10/22 0848  Vitals shown include unvalidated device data.  Last Pain:  Vitals:   11/10/22 0756  TempSrc: Temporal  PainSc: 0-No pain         Complications: No notable events documented.

## 2022-11-10 NOTE — Op Note (Signed)
[facility] Gastroenterology Patient Name: Andrew Andersen Procedure Date: 11/10/2022 8:19 AM MRN: [id number] Account #: [id number] Date of Birth: September 17, 1975 Admit Type: Outpatient Age: 47 Room: [facility] ENDO ROOM 1 Gender: Male Note Status: Finalized Instrument Name: Nelda Marseille [id number] Procedure:             Colonoscopy Indications:           High risk colon cancer surveillance: Personal history                         of colonic polyps Providers:             Midge Minium MD, MD Referring MD:          No Local Md, MD (Referring MD) Medicines:             Propofol per Anesthesia Complications:         No immediate complications. Procedure:             Pre-Anesthesia Assessment:                        - Prior to the procedure, a History and Physical was                         performed, and patient medications and allergies were                         reviewed. The patient's tolerance of previous                         anesthesia was also reviewed. The risks and benefits                         of the procedure and the sedation options and risks                         were discussed with the patient. All questions were                         answered, and informed consent was obtained. Prior                         Anticoagulants: The patient has taken no anticoagulant                         or antiplatelet agents. ASA Grade Assessment: II - A                         patient with mild systemic disease. After reviewing                         the risks and benefits, the patient was deemed in                         satisfactory condition to undergo the procedure.                        After obtaining informed consent, the colonoscope was  passed under direct vision. Throughout the procedure,                         the patient's blood pressure, pulse, and oxygen                         saturations were monitored continuously. The                          Colonoscope was introduced through the anus and                         advanced to the the cecum, identified by appendiceal                         orifice and ileocecal valve. The colonoscopy was                         performed without difficulty. The patient tolerated                         the procedure well. The quality of the bowel                         preparation was excellent. Findings:      The perianal and digital rectal examinations were normal.      A 2 mm polyp was found in the transverse colon. The polyp was sessile.       The polyp was removed with a cold biopsy forceps. Resection and       retrieval were complete.      Two sessile polyps were found in the sigmoid colon. The polyps were 3 to       4 mm in size. These polyps were removed with a cold snare. Resection and       retrieval were complete.      Multiple small-mouthed diverticula were found in the sigmoid colon.      Non-bleeding internal hemorrhoids were found during retroflexion. The       hemorrhoids were Grade II (internal hemorrhoids that prolapse but reduce       spontaneously). Impression:            - One 2 mm polyp in the transverse colon, removed with                         a cold biopsy forceps. Resected and retrieved.                        - Two 3 to 4 mm polyps in the sigmoid colon, removed                         with a cold snare. Resected and retrieved.                        - Diverticulosis in the sigmoid colon.                        - Non-bleeding internal hemorrhoids. Recommendation:        - Discharge patient to home.                        -  Resume previous diet.                        - Continue present medications.                        - Await pathology results.                        - Repeat colonoscopy in 3 years for surveillance. Procedure Code(s):     --- Professional ---                        [id number], Colonoscopy, flexible; with removal of                          tumor(s), polyp(s), or other lesion(s) by snare                         technique                        45380, 59, Colonoscopy, flexible; with biopsy, single                         or multiple Diagnosis Code(s):     --- Professional ---                        Z86.010, Personal history of colonic polyps                        D12.3, Benign neoplasm of transverse colon (hepatic                         flexure or splenic flexure) CPT copyright 2022 American Medical Association. All rights reserved. The codes documented in this report are preliminary and upon coder review may  be revised to meet current compliance requirements. Midge Minium MD, MD 11/10/2022 8:46:00 AM This report has been signed electronically. Number of Addenda: 0 Note Initiated On: 11/10/2022 8:19 AM Scope Withdrawal Time: 0 hours 11 minutes 6 seconds  Total Procedure Duration: 0 hours 14 minutes 14 seconds  Estimated Blood Loss:  Estimated blood loss: none.      [facility]

## 2022-11-10 NOTE — Anesthesia Preprocedure Evaluation (Signed)
Anesthesia Evaluation  Patient identified by MRN, date of birth, ID band Patient awake    Reviewed: Allergy & Precautions, NPO status , Patient's Chart, lab work & pertinent test results  History of Anesthesia Complications Negative for: history of anesthetic complications  Airway Mallampati: III  TM Distance: >3 FB Neck ROM: full    Dental  (+) Chipped   Pulmonary neg shortness of breath, former smoker   Pulmonary exam normal        Cardiovascular Exercise Tolerance: Good hypertension, (-) angina Normal cardiovascular exam     Neuro/Psych  PSYCHIATRIC DISORDERS      negative neurological ROS     GI/Hepatic negative GI ROS, Neg liver ROS,neg GERD  ,,  Endo/Other  negative endocrine ROS    Renal/GU negative Renal ROS  negative genitourinary   Musculoskeletal   Abdominal   Peds  Hematology negative hematology ROS (+)   Anesthesia Other Findings Past Medical History: No date: Allergy No date: Hypertension  Past Surgical History: 08/09/2019: COLONOSCOPY WITH PROPOFOL; N/A     Comment:  Procedure: COLONOSCOPY WITH PROPOFOL;  Surgeon: Wohl,               Darren, MD;  Location: [facility] ENDOSCOPY;  Service:               Endoscopy;  Laterality: N/A; No date: KNEE SURGERY; Left  BMI    Body Mass Index: 40.87 kg/m      Reproductive/Obstetrics negative OB ROS                             Anesthesia Physical Anesthesia Plan  ASA: 3  Anesthesia Plan: General   Post-op Pain Management:    Induction: Intravenous  PONV Risk Score and Plan: Propofol infusion and TIVA  Airway Management Planned: Natural Airway and Nasal Cannula  Additional Equipment:   Intra-op Plan:   Post-operative Plan:   Informed Consent: I have reviewed the patients History and Physical, chart, labs and discussed the procedure including the risks, benefits and alternatives for the proposed anesthesia with the  patient or authorized representative who has indicated his/her understanding and acceptance.     Dental Advisory Given  Plan Discussed with: Anesthesiologist, CRNA and Surgeon  Anesthesia Plan Comments: (Patient consented for risks of anesthesia including but not limited to:  - adverse reactions to medications - risk of airway placement if required - damage to eyes, teeth, lips or other oral mucosa - nerve damage due to positioning  - sore throat or hoarseness - Damage to heart, brain, nerves, lungs, other parts of body or loss of life  Patient voiced understanding.)       Anesthesia Quick Evaluation  

## 2022-11-11 ENCOUNTER — Encounter: Payer: Self-pay | Admitting: Gastroenterology

## 2022-11-11 LAB — SURGICAL PATHOLOGY

## 2022-11-14 DIAGNOSIS — F5105 Insomnia due to other mental disorder: Secondary | ICD-10-CM | POA: Diagnosis not present

## 2022-11-14 DIAGNOSIS — F320 Major depressive disorder, single episode, mild: Secondary | ICD-10-CM | POA: Diagnosis not present

## 2022-11-14 DIAGNOSIS — F409 Phobic anxiety disorder, unspecified: Secondary | ICD-10-CM | POA: Diagnosis not present

## 2022-11-20 DIAGNOSIS — G4733 Obstructive sleep apnea (adult) (pediatric): Secondary | ICD-10-CM | POA: Diagnosis not present

## 2022-11-25 DIAGNOSIS — J301 Allergic rhinitis due to pollen: Secondary | ICD-10-CM | POA: Diagnosis not present

## 2022-11-25 DIAGNOSIS — J3081 Allergic rhinitis due to animal (cat) (dog) hair and dander: Secondary | ICD-10-CM | POA: Diagnosis not present

## 2022-11-25 DIAGNOSIS — J3089 Other allergic rhinitis: Secondary | ICD-10-CM | POA: Diagnosis not present

## 2022-11-28 ENCOUNTER — Ambulatory Visit: Payer: BC Managed Care – PPO | Admitting: Podiatry

## 2022-11-29 DIAGNOSIS — F409 Phobic anxiety disorder, unspecified: Secondary | ICD-10-CM | POA: Diagnosis not present

## 2022-11-29 DIAGNOSIS — F320 Major depressive disorder, single episode, mild: Secondary | ICD-10-CM | POA: Diagnosis not present

## 2022-12-01 DIAGNOSIS — J301 Allergic rhinitis due to pollen: Secondary | ICD-10-CM | POA: Diagnosis not present

## 2022-12-02 DIAGNOSIS — J3089 Other allergic rhinitis: Secondary | ICD-10-CM | POA: Diagnosis not present

## 2022-12-18 ENCOUNTER — Other Ambulatory Visit: Payer: Self-pay | Admitting: Family Medicine

## 2022-12-20 DIAGNOSIS — G4733 Obstructive sleep apnea (adult) (pediatric): Secondary | ICD-10-CM | POA: Diagnosis not present

## 2022-12-20 NOTE — Telephone Encounter (Signed)
Requested Prescriptions  Pending Prescriptions Disp Refills   losartan (COZAAR) 25 MG tablet [Pharmacy Med Name: LOSARTAN POTASSIUM 25 MG TAB] 90 tablet 0    Sig: TAKE 1 TABLET (25 MG TOTAL) BY MOUTH DAILY.     Cardiovascular:  Angiotensin Receptor Blockers Failed - 12/18/2022  1:28 AM      Failed - Last BP in normal range    BP Readings from Last 1 Encounters:  11/10/22 (!) 143/95         Passed - Cr in normal range and within 180 days    Creat  Date Value Ref Range Status  06/30/2017 1.17 0.60 - 1.35 mg/dL Final   Creatinine, Ser  Date Value Ref Range Status  06/29/2022 1.12 0.76 - 1.27 mg/dL Final         Passed - K in normal range and within 180 days    Potassium  Date Value Ref Range Status  06/29/2022 4.0 3.5 - 5.2 mmol/L Final         Passed - Patient is not pregnant      Passed - Valid encounter within last 6 months    Recent Outpatient Visits           5 months ago Elevated blood pressure reading   [facility] Caro Laroche, DO   6 months ago Sleep apnea in adult   [facility] Simmons-Robinson, Tawanna Cooler, MD   1 year ago Annual physical exam   [facility] Bosie Clos, MD   2 years ago COVID   [facility] Bosie Clos, MD   2 years ago Annual physical exam   [facility] Bosie Clos, MD

## 2022-12-29 DIAGNOSIS — J3089 Other allergic rhinitis: Secondary | ICD-10-CM | POA: Diagnosis not present

## 2022-12-29 DIAGNOSIS — J301 Allergic rhinitis due to pollen: Secondary | ICD-10-CM | POA: Diagnosis not present

## 2022-12-29 DIAGNOSIS — J3081 Allergic rhinitis due to animal (cat) (dog) hair and dander: Secondary | ICD-10-CM | POA: Diagnosis not present

## 2022-12-29 DIAGNOSIS — F320 Major depressive disorder, single episode, mild: Secondary | ICD-10-CM | POA: Diagnosis not present

## 2022-12-29 DIAGNOSIS — F409 Phobic anxiety disorder, unspecified: Secondary | ICD-10-CM | POA: Diagnosis not present

## 2022-12-29 DIAGNOSIS — H1045 Other chronic allergic conjunctivitis: Secondary | ICD-10-CM | POA: Diagnosis not present

## 2022-12-29 DIAGNOSIS — F5105 Insomnia due to other mental disorder: Secondary | ICD-10-CM | POA: Diagnosis not present

## 2023-01-12 DIAGNOSIS — J3089 Other allergic rhinitis: Secondary | ICD-10-CM | POA: Diagnosis not present

## 2023-01-12 DIAGNOSIS — J301 Allergic rhinitis due to pollen: Secondary | ICD-10-CM | POA: Diagnosis not present

## 2023-01-12 DIAGNOSIS — J3081 Allergic rhinitis due to animal (cat) (dog) hair and dander: Secondary | ICD-10-CM | POA: Diagnosis not present

## 2023-01-20 DIAGNOSIS — G4733 Obstructive sleep apnea (adult) (pediatric): Secondary | ICD-10-CM | POA: Diagnosis not present

## 2023-02-09 DIAGNOSIS — J3081 Allergic rhinitis due to animal (cat) (dog) hair and dander: Secondary | ICD-10-CM | POA: Diagnosis not present

## 2023-02-09 DIAGNOSIS — J3089 Other allergic rhinitis: Secondary | ICD-10-CM | POA: Diagnosis not present

## 2023-02-09 DIAGNOSIS — J301 Allergic rhinitis due to pollen: Secondary | ICD-10-CM | POA: Diagnosis not present

## 2023-02-19 DIAGNOSIS — G4733 Obstructive sleep apnea (adult) (pediatric): Secondary | ICD-10-CM | POA: Diagnosis not present

## 2023-02-28 DIAGNOSIS — F320 Major depressive disorder, single episode, mild: Secondary | ICD-10-CM | POA: Diagnosis not present

## 2023-02-28 DIAGNOSIS — F409 Phobic anxiety disorder, unspecified: Secondary | ICD-10-CM | POA: Diagnosis not present

## 2023-03-02 ENCOUNTER — Ambulatory Visit: Payer: BC Managed Care – PPO | Admitting: Family Medicine

## 2023-03-03 ENCOUNTER — Ambulatory Visit: Payer: Self-pay

## 2023-03-03 ENCOUNTER — Ambulatory Visit: Payer: BC Managed Care – PPO | Admitting: Family Medicine

## 2023-03-03 ENCOUNTER — Encounter: Payer: Self-pay | Admitting: Family Medicine

## 2023-03-03 VITALS — BP 140/100 | Ht >= 80 in | Wt 380.8 lb

## 2023-03-03 DIAGNOSIS — R7989 Other specified abnormal findings of blood chemistry: Secondary | ICD-10-CM

## 2023-03-03 DIAGNOSIS — E782 Mixed hyperlipidemia: Secondary | ICD-10-CM

## 2023-03-03 DIAGNOSIS — G4733 Obstructive sleep apnea (adult) (pediatric): Secondary | ICD-10-CM

## 2023-03-03 DIAGNOSIS — N411 Chronic prostatitis: Secondary | ICD-10-CM

## 2023-03-03 DIAGNOSIS — Z1159 Encounter for screening for other viral diseases: Secondary | ICD-10-CM | POA: Diagnosis not present

## 2023-03-03 DIAGNOSIS — F39 Unspecified mood [affective] disorder: Secondary | ICD-10-CM

## 2023-03-03 DIAGNOSIS — I10 Essential (primary) hypertension: Secondary | ICD-10-CM

## 2023-03-03 DIAGNOSIS — Z114 Encounter for screening for human immunodeficiency virus [HIV]: Secondary | ICD-10-CM

## 2023-03-03 DIAGNOSIS — R739 Hyperglycemia, unspecified: Secondary | ICD-10-CM | POA: Diagnosis not present

## 2023-03-03 MED ORDER — LOSARTAN POTASSIUM-HCTZ 100-12.5 MG PO TABS
1.0000 | ORAL_TABLET | Freq: Every day | ORAL | 1 refills | Status: DC
Start: 2023-03-03 — End: 2023-08-24

## 2023-03-03 MED ORDER — SULFAMETHOXAZOLE-TRIMETHOPRIM 800-160 MG PO TABS
1.0000 | ORAL_TABLET | Freq: Two times a day (BID) | ORAL | 0 refills | Status: AC
Start: 2023-03-03 — End: ?

## 2023-03-03 NOTE — Assessment & Plan Note (Signed)
Chronic, uncontrolled Recommend titration of antihypertensive from 50 to 100-12.5 mg to assist given consistent DBP elevation

## 2023-03-03 NOTE — Assessment & Plan Note (Signed)
Chronic, with recent flare Request for ABX Recommend 2 week course  F/u as needed

## 2023-03-03 NOTE — Progress Notes (Signed)
Established patient visit   Patient: Andrew Andersen.   DOB: [birth year]   [name]  MRN: [id number] Visit Date: 03/03/2023  Today's healthcare provider: Jacky Kindle, FNP  Introduced to nurse practitioner role and practice setting.  All questions answered.  Discussed provider/patient relationship and expectations.  Subjective    HPI HPI     Medical Management of Chronic Issues    Additional comments: Patient states that his Bp has been elevated since covid (2 yrs ago) norm 140/100, Has chronic prostatitis, not being able to sleep, has cpap      Last edited by Rolly Salter, CMA on 03/03/2023  1:12 PM.      Medications: Outpatient Medications Prior to Visit  Medication Sig   EPINEPHrine 0.3 mg/0.3 mL IJ SOAJ injection epinephrine 0.3 mg/0.3 mL injection, auto-injector   sertraline (ZOLOFT) 50 MG tablet Take 50 mg by mouth every morning.   [DISCONTINUED] losartan (COZAAR) 25 MG tablet TAKE 1 TABLET (25 MG TOTAL) BY MOUTH DAILY.   No facility-administered medications prior to visit.    Review of Systems    Objective    BP (!) 140/100   Ht 6\' 8"  (2.032 m)   Wt (!) 380 lb 12.8 oz (172.7 kg)   BMI 41.83 kg/m   Physical Exam Vitals and nursing note reviewed.  Constitutional:      Appearance: Normal appearance. He is obese.  HENT:     Head: Normocephalic and atraumatic.  Cardiovascular:     Rate and Rhythm: Normal rate and regular rhythm.     Pulses: Normal pulses.     Heart sounds: Normal heart sounds.  Pulmonary:     Effort: Pulmonary effort is normal.     Breath sounds: Normal breath sounds.  Genitourinary:    Comments: Complaints of dull aching rectal/groin pain Musculoskeletal:        General: Normal range of motion.     Cervical back: Normal range of motion.  Skin:    General: Skin is warm and dry.     Capillary Refill: Capillary refill takes less than 2 seconds.  Neurological:     General: No focal deficit present.     Mental Status: He is  alert and oriented to person, place, and time. Mental status is at baseline.  Psychiatric:        Mood and Affect: Mood normal.        Behavior: Behavior normal.        Thought Content: Thought content normal.        Judgment: Judgment normal.     No results found for any visits on 03/03/23.  Assessment & Plan     Problem List Items Addressed This Visit       Cardiovascular and Mediastinum   Primary hypertension - Primary    Chronic, uncontrolled Recommend titration of antihypertensive from 50 to 100-12.5 mg to assist given consistent DBP elevation       Relevant Medications   losartan-hydrochlorothiazide (HYZAAR) 100-12.5 MG tablet   Other Relevant Orders   CBC with Differential/Platelet   Comprehensive Metabolic Panel (CMET)   TSH + free T4     Respiratory   OSA (obstructive sleep apnea)    Chronic, uncontrolled Reports difficulty with mask fit; encouraged to reach out to company to address other choices as this will assist with weight, BP, and mood        Genitourinary   Prostatitis, chronic    Chronic, with recent flare Request  for ABX Recommend 2 week course  F/u as needed      Relevant Medications   sulfamethoxazole-trimethoprim (BACTRIM DS) 800-160 MG tablet     Other   Elevated LFTs    Hx of moderate drinking risk; pt did not discuss. Repeat CMP given hx of elevated LFTs.      Relevant Orders   Comprehensive Metabolic Panel (CMET)   Elevated serum glucose    R/o DM given glucose elevation; recommend checking A1c. Continue to recommend balanced, lower carb meals. Smaller meal size, adding snacks. Choosing water as drink of choice and increasing purposeful exercise.       Relevant Orders   Comprehensive Metabolic Panel (CMET)   Hemoglobin A1c   Encounter for hepatitis C screening test for low risk patient    Low risk screen Treatable, and curable. If left untreated Hep C can lead to cirrhosis and liver failure. Encourage routine testing; recommend  repeat testing if risk factors change.       Relevant Orders   Hepatitis C Antibody   Encounter for screening for HIV    Low risk screen; encouraged to "know your status" Recommend repeat screen if risk factors change       Relevant Orders   HIV antibody (with reflex)   Mixed hyperlipidemia    Repeat LP recommend diet low in saturated fat and regular exercise - 30 min at least 5 times per week The 10-year ASCVD risk score (Arnett DK, et al., 2019) is: 4.1%       Relevant Medications   losartan-hydrochlorothiazide (HYZAAR) 100-12.5 MG tablet   Other Relevant Orders   Lipid panel   Mood disorder (HCC)    Chronic, variable Some situational anxiety and anxiety with driving On zoloft 50 mg; followed by psych who referred him today to assist with BP control Plans to trial trazodone as well to assist with sleep; pt unclear of dose as new rx.      Morbid obesity (HCC)    Chronic, stable Body mass index is 41.83 kg/m. Discussed importance of healthy weight management Discussed diet and exercise       Return in about 3 months (around 06/03/2023), or if symptoms worsen or fail to improve, for annual examination.     Leilani Merl, FNP, have reviewed all documentation for this visit. The documentation on 03/03/23 for the exam, diagnosis, procedures, and orders are all accurate and complete.  Jacky Kindle, FNP  [facility] [phone] (phone) [phone] (fax)  [facility]

## 2023-03-03 NOTE — Assessment & Plan Note (Signed)
Low risk screen Treatable, and curable. If left untreated Hep C can lead to cirrhosis and liver failure. Encourage routine testing; recommend repeat testing if risk factors change.  

## 2023-03-03 NOTE — Assessment & Plan Note (Signed)
Chronic, stable Body mass index is 41.83 kg/m. Discussed importance of healthy weight management Discussed diet and exercise

## 2023-03-03 NOTE — Patient Instructions (Signed)
-  Please let us know in 1 month when you see psychiatry again how your blood pressure is doing; if it remains elevated above 129/79 recommend another office visit -Reach out to your CPAP provider to assist with mask fitting -Annual physical recommended in 3 months -Follow up as needed for prostatitis complaints following antibiotic treatment

## 2023-03-03 NOTE — Assessment & Plan Note (Signed)
R/o DM given glucose elevation; recommend checking A1c. Continue to recommend balanced, lower carb meals. Smaller meal size, adding snacks. Choosing water as drink of choice and increasing purposeful exercise.

## 2023-03-03 NOTE — Assessment & Plan Note (Signed)
Repeat LP recommend diet low in saturated fat and regular exercise - 30 min at least 5 times per week The 10-year ASCVD risk score (Arnett DK, et al., 2019) is: 4.1%

## 2023-03-03 NOTE — Assessment & Plan Note (Signed)
Chronic, uncontrolled Reports difficulty with mask fit; encouraged to reach out to company to address other choices as this will assist with weight, BP, and mood

## 2023-03-03 NOTE — Telephone Encounter (Signed)
Pt is calling in because he needs clarification on taking losartan-hydrochlorothiazide (HYZAAR) 100-12.5 MG tablet [[id number]]. Pt says he was prescribed Losartan 25 mg potassium and wants to know is he supposed to take both or should he just take the one he was just prescribed.   Left message to call back.

## 2023-03-03 NOTE — Telephone Encounter (Signed)
  Chief Complaint: Patient needs clarification on medication from visit today  Disposition: [] ED /[] Urgent Care (no appt availability in office) / [] Appointment(In office/virtual)/ []  [facility]/ [x] Home Care/ [] Refused Recommended Disposition /[]  Mobile Bus/ []  Follow-up with PCP Additional Notes: Only take:  losartan-hydrochlorothiazide (HYZAAR) 100-12.5 MG tablet - other losartan dosage has been discontinued   Reason for Disposition  Caller has medicine question only, adult not sick, AND triager answers question  Answer Assessment - Initial Assessment Questions 1. NAME of MEDICINE: "What medicine(s) are you calling about?"     Losartan 25 and losartan-hydrochlorothiazide 100-12.5 2. QUESTION: "What is your question?" (e.g., double dose of medicine, side effect)     Patient wants to clarify- take both or just take new Rx 3. PRESCRIBER: "Who prescribed the medicine?" Reason: if prescribed by specialist, call should be referred to that group.     PCP Patient notified per chart note- stop losartan 25 and replace with losartan -[id number]  Protocols used: Medication Question Call-A-AH

## 2023-03-03 NOTE — Assessment & Plan Note (Signed)
Chronic, variable Some situational anxiety and anxiety with driving On zoloft 50 mg; followed by psych who referred him today to assist with BP control Plans to trial trazodone as well to assist with sleep; pt unclear of dose as new rx.

## 2023-03-03 NOTE — Assessment & Plan Note (Signed)
Low risk screen; encouraged to "know your status" Recommend repeat screen if risk factors change  

## 2023-03-03 NOTE — Assessment & Plan Note (Signed)
Hx of moderate drinking risk; pt did not discuss. Repeat CMP given hx of elevated LFTs.

## 2023-03-06 DIAGNOSIS — F320 Major depressive disorder, single episode, mild: Secondary | ICD-10-CM | POA: Diagnosis not present

## 2023-03-06 DIAGNOSIS — F5105 Insomnia due to other mental disorder: Secondary | ICD-10-CM | POA: Diagnosis not present

## 2023-03-06 DIAGNOSIS — F409 Phobic anxiety disorder, unspecified: Secondary | ICD-10-CM | POA: Diagnosis not present

## 2023-03-20 ENCOUNTER — Other Ambulatory Visit: Payer: Self-pay | Admitting: Family Medicine

## 2023-03-22 DIAGNOSIS — G4733 Obstructive sleep apnea (adult) (pediatric): Secondary | ICD-10-CM | POA: Diagnosis not present

## 2023-03-31 DIAGNOSIS — F409 Phobic anxiety disorder, unspecified: Secondary | ICD-10-CM | POA: Diagnosis not present

## 2023-03-31 DIAGNOSIS — F320 Major depressive disorder, single episode, mild: Secondary | ICD-10-CM | POA: Diagnosis not present

## 2023-04-18 DIAGNOSIS — J3081 Allergic rhinitis due to animal (cat) (dog) hair and dander: Secondary | ICD-10-CM | POA: Diagnosis not present

## 2023-04-18 DIAGNOSIS — J3089 Other allergic rhinitis: Secondary | ICD-10-CM | POA: Diagnosis not present

## 2023-04-18 DIAGNOSIS — J301 Allergic rhinitis due to pollen: Secondary | ICD-10-CM | POA: Diagnosis not present

## 2023-04-20 ENCOUNTER — Encounter: Payer: Self-pay | Admitting: Emergency Medicine

## 2023-04-20 ENCOUNTER — Other Ambulatory Visit: Payer: Self-pay

## 2023-04-20 DIAGNOSIS — K651 Peritoneal abscess: Secondary | ICD-10-CM | POA: Diagnosis not present

## 2023-04-20 DIAGNOSIS — Z6841 Body Mass Index (BMI) 40.0 and over, adult: Secondary | ICD-10-CM | POA: Diagnosis not present

## 2023-04-20 DIAGNOSIS — F419 Anxiety disorder, unspecified: Secondary | ICD-10-CM | POA: Diagnosis not present

## 2023-04-20 DIAGNOSIS — K429 Umbilical hernia without obstruction or gangrene: Secondary | ICD-10-CM | POA: Diagnosis not present

## 2023-04-20 DIAGNOSIS — K3580 Unspecified acute appendicitis: Secondary | ICD-10-CM | POA: Diagnosis not present

## 2023-04-20 DIAGNOSIS — R188 Other ascites: Secondary | ICD-10-CM | POA: Diagnosis not present

## 2023-04-20 DIAGNOSIS — J301 Allergic rhinitis due to pollen: Secondary | ICD-10-CM | POA: Diagnosis not present

## 2023-04-20 DIAGNOSIS — K589 Irritable bowel syndrome without diarrhea: Secondary | ICD-10-CM | POA: Diagnosis not present

## 2023-04-20 DIAGNOSIS — G4733 Obstructive sleep apnea (adult) (pediatric): Secondary | ICD-10-CM | POA: Diagnosis not present

## 2023-04-20 DIAGNOSIS — Z8249 Family history of ischemic heart disease and other diseases of the circulatory system: Secondary | ICD-10-CM

## 2023-04-20 DIAGNOSIS — Z79899 Other long term (current) drug therapy: Secondary | ICD-10-CM

## 2023-04-20 DIAGNOSIS — Z4682 Encounter for fitting and adjustment of non-vascular catheter: Secondary | ICD-10-CM | POA: Diagnosis not present

## 2023-04-20 DIAGNOSIS — Z820 Family history of epilepsy and other diseases of the nervous system: Secondary | ICD-10-CM | POA: Diagnosis not present

## 2023-04-20 DIAGNOSIS — R1031 Right lower quadrant pain: Secondary | ICD-10-CM | POA: Diagnosis not present

## 2023-04-20 DIAGNOSIS — I10 Essential (primary) hypertension: Secondary | ICD-10-CM | POA: Diagnosis present

## 2023-04-20 DIAGNOSIS — Z87891 Personal history of nicotine dependence: Secondary | ICD-10-CM | POA: Diagnosis not present

## 2023-04-20 DIAGNOSIS — Y838 Other surgical procedures as the cause of abnormal reaction of the patient, or of later complication, without mention of misadventure at the time of the procedure: Secondary | ICD-10-CM | POA: Diagnosis not present

## 2023-04-20 DIAGNOSIS — K3532 Acute appendicitis with perforation and localized peritonitis, without abscess: Secondary | ICD-10-CM | POA: Diagnosis not present

## 2023-04-20 DIAGNOSIS — K9189 Other postprocedural complications and disorders of digestive system: Secondary | ICD-10-CM | POA: Diagnosis not present

## 2023-04-20 DIAGNOSIS — J3089 Other allergic rhinitis: Secondary | ICD-10-CM | POA: Diagnosis not present

## 2023-04-20 DIAGNOSIS — J3081 Allergic rhinitis due to animal (cat) (dog) hair and dander: Secondary | ICD-10-CM | POA: Diagnosis not present

## 2023-04-20 DIAGNOSIS — K56609 Unspecified intestinal obstruction, unspecified as to partial versus complete obstruction: Secondary | ICD-10-CM | POA: Diagnosis not present

## 2023-04-20 DIAGNOSIS — K5730 Diverticulosis of large intestine without perforation or abscess without bleeding: Secondary | ICD-10-CM | POA: Diagnosis not present

## 2023-04-20 DIAGNOSIS — F32A Depression, unspecified: Secondary | ICD-10-CM | POA: Diagnosis present

## 2023-04-20 DIAGNOSIS — K567 Ileus, unspecified: Secondary | ICD-10-CM | POA: Diagnosis not present

## 2023-04-20 DIAGNOSIS — K37 Unspecified appendicitis: Secondary | ICD-10-CM | POA: Diagnosis not present

## 2023-04-20 DIAGNOSIS — K3589 Other acute appendicitis without perforation or gangrene: Secondary | ICD-10-CM | POA: Diagnosis not present

## 2023-04-20 LAB — COMPREHENSIVE METABOLIC PANEL
ALT: 31 U/L (ref 0–44)
AST: 21 U/L (ref 15–41)
Albumin: 4.2 g/dL (ref 3.5–5.0)
Alkaline Phosphatase: 52 U/L (ref 38–126)
Anion gap: 13 (ref 5–15)
BUN: 15 mg/dL (ref 6–20)
CO2: 24 mmol/L (ref 22–32)
Calcium: 8.8 mg/dL — ABNORMAL LOW (ref 8.9–10.3)
Chloride: 100 mmol/L (ref 98–111)
Creatinine, Ser: 1.16 mg/dL (ref 0.61–1.24)
GFR, Estimated: >60 mL/min (ref >60)
Glucose, Bld: 113 mg/dL — ABNORMAL HIGH (ref 70–99)
Potassium: 3.2 mmol/L — ABNORMAL LOW (ref 3.5–5.1)
Sodium: 137 mmol/L (ref 135–145)
Total Bilirubin: 1.2 mg/dL (ref 0.3–1.2)
Total Protein: 7.2 g/dL (ref 6.5–8.1)

## 2023-04-20 LAB — LIPASE, BLOOD: Lipase: 35 U/L (ref 11–51)

## 2023-04-20 LAB — CBC
HCT: 39.9 % (ref 39.0–52.0)
Hemoglobin: 13.4 g/dL (ref 13.0–17.0)
MCH: 29.9 pg (ref 26.0–34.0)
MCHC: 33.6 g/dL (ref 30.0–36.0)
MCV: 89.1 fL (ref 80.0–100.0)
Platelets: 182 10*3/uL (ref 150–400)
RBC: 4.48 MIL/uL (ref 4.22–5.81)
RDW: 13.3 % (ref 11.5–15.5)
WBC: 8.8 10*3/uL (ref 4.0–10.5)
nRBC: 0 % (ref 0.0–0.2)

## 2023-04-20 NOTE — ED Triage Notes (Signed)
Pt presents ambulatory to triage via POV with complaints of RLQ pain that started a few days ago. Pt states the pain is dull and intermittent in nature. Rates the discomfort a 5/10. A&Ox4 at this time. Denies N/V/D, CP or SOB.

## 2023-04-21 ENCOUNTER — Encounter
Admission: EM | Disposition: A | Discharge status: Home / Self Care | Payer: Self-pay | Source: Home / Self Care | Attending: General Surgery

## 2023-04-21 ENCOUNTER — Other Ambulatory Visit: Payer: Self-pay

## 2023-04-21 ENCOUNTER — Emergency Department: Payer: BC Managed Care – PPO | Admitting: Anesthesiology

## 2023-04-21 ENCOUNTER — Inpatient Hospital Stay
Admission: EM | Admit: 2023-04-21 | Discharge: 2023-05-03 | DRG: 330 | Disposition: A | Discharge status: Home / Self Care | Payer: BC Managed Care – PPO | Attending: General Surgery | Admitting: General Surgery

## 2023-04-21 ENCOUNTER — Emergency Department: Payer: BC Managed Care – PPO

## 2023-04-21 DIAGNOSIS — K3589 Other acute appendicitis without perforation or gangrene: Principal | ICD-10-CM

## 2023-04-21 DIAGNOSIS — K3532 Acute appendicitis with perforation and localized peritonitis, without abscess: Principal | ICD-10-CM | POA: Diagnosis present

## 2023-04-21 DIAGNOSIS — K3580 Unspecified acute appendicitis: Secondary | ICD-10-CM | POA: Insufficient documentation

## 2023-04-21 HISTORY — PX: LAPAROSCOPIC APPENDECTOMY: SHX408

## 2023-04-21 LAB — URINALYSIS, ROUTINE W REFLEX MICROSCOPIC
Bilirubin Urine: NEGATIVE
Glucose, UA: NEGATIVE mg/dL
Hgb urine dipstick: NEGATIVE
Ketones, ur: NEGATIVE mg/dL
Leukocytes,Ua: NEGATIVE
Nitrite: NEGATIVE
Protein, ur: NEGATIVE mg/dL
Specific Gravity, Urine: >1.046 — ABNORMAL HIGH (ref 1.005–1.030)
pH: 6 (ref 5.0–8.0)

## 2023-04-21 SURGERY — APPENDECTOMY, LAPAROSCOPIC
Anesthesia: General | Site: Abdomen

## 2023-04-21 MED ORDER — DEXAMETHASONE SODIUM PHOSPHATE 10 MG/ML IJ SOLN
INTRAMUSCULAR | Status: AC
  Filled 2023-04-21: qty 1

## 2023-04-21 MED ORDER — ACETAMINOPHEN 10 MG/ML IV SOLN
INTRAVENOUS | Status: DC | PRN
  Administered 2023-04-21: 1000 mg via INTRAVENOUS

## 2023-04-21 MED ORDER — ONDANSETRON HCL 4 MG/2ML IJ SOLN
4.0000 mg | Freq: Four times a day (QID) | INTRAMUSCULAR | Status: DC | PRN
  Administered 2023-04-21 – 2023-04-26 (×6): 4 mg via INTRAVENOUS
  Filled 2023-04-21 (×6): qty 2

## 2023-04-21 MED ORDER — BUPIVACAINE-EPINEPHRINE (PF) 0.5% -1:200000 IJ SOLN
INTRAMUSCULAR | Status: DC | PRN
  Administered 2023-04-21: 20 mL via INTRAMUSCULAR

## 2023-04-21 MED ORDER — METOPROLOL TARTRATE 5 MG/5ML IV SOLN
5.0000 mg | Freq: Three times a day (TID) | INTRAVENOUS | Status: DC | PRN
  Administered 2023-04-30 – 2023-05-01 (×2): 5 mg via INTRAVENOUS
  Filled 2023-04-21 (×3): qty 5

## 2023-04-21 MED ORDER — CHLORHEXIDINE GLUCONATE 0.12 % MT SOLN
OROMUCOSAL | Status: AC
  Filled 2023-04-21: qty 15

## 2023-04-21 MED ORDER — MIDAZOLAM HCL 2 MG/2ML IJ SOLN
INTRAMUSCULAR | Status: DC | PRN
  Administered 2023-04-21: 2 mg via INTRAVENOUS

## 2023-04-21 MED ORDER — LACTATED RINGERS IV SOLN: INTRAVENOUS | Status: DC

## 2023-04-21 MED ORDER — HEMOSTATIC AGENTS (NO CHARGE) OPTIME
TOPICAL | Status: DC | PRN
Start: 2023-04-21 — End: 2023-04-21
  Administered 2023-04-21: 1 via TOPICAL

## 2023-04-21 MED ORDER — ROCURONIUM BROMIDE 10 MG/ML (PF) SYRINGE
PREFILLED_SYRINGE | INTRAVENOUS | Status: AC
  Filled 2023-04-21: qty 10

## 2023-04-21 MED ORDER — FENTANYL CITRATE (PF) 100 MCG/2ML IJ SOLN
INTRAMUSCULAR | Status: AC
  Filled 2023-04-21: qty 2

## 2023-04-21 MED ORDER — DEXAMETHASONE SODIUM PHOSPHATE 10 MG/ML IJ SOLN
INTRAMUSCULAR | Status: DC | PRN
  Administered 2023-04-21: 10 mg via INTRAVENOUS

## 2023-04-21 MED ORDER — ONDANSETRON HCL 4 MG/2ML IJ SOLN
INTRAMUSCULAR | Status: AC
  Filled 2023-04-21: qty 2

## 2023-04-21 MED ORDER — MIDAZOLAM HCL 2 MG/2ML IJ SOLN
INTRAMUSCULAR | Status: AC
  Filled 2023-04-21: qty 2

## 2023-04-21 MED ORDER — PHENYLEPHRINE HCL-NACL 20-0.9 MG/250ML-% IV SOLN
INTRAVENOUS | Status: DC | PRN
  Administered 2023-04-21: 30 ug/min via INTRAVENOUS

## 2023-04-21 MED ORDER — SODIUM CHLORIDE 0.9 % IR SOLN
Status: DC | PRN
  Administered 2023-04-21: 1
  Administered 2023-04-21: 1000 mL

## 2023-04-21 MED ORDER — EPHEDRINE SULFATE-NACL 50-0.9 MG/10ML-% IV SOSY
PREFILLED_SYRINGE | INTRAVENOUS | Status: DC | PRN
Start: 2023-04-21 — End: 2023-04-21
  Administered 2023-04-21: 5 mg via INTRAVENOUS
  Administered 2023-04-21: 10 mg via INTRAVENOUS

## 2023-04-21 MED ORDER — PROPOFOL 10 MG/ML IV BOLUS
INTRAVENOUS | Status: AC
  Filled 2023-04-21: qty 40

## 2023-04-21 MED ORDER — FENTANYL CITRATE (PF) 100 MCG/2ML IJ SOLN
25.0000 ug | INTRAMUSCULAR | Status: DC | PRN
  Administered 2023-04-21 (×4): 25 ug via INTRAVENOUS

## 2023-04-21 MED ORDER — ORAL CARE MOUTH RINSE: 15.0000 mL | Freq: Once | OROMUCOSAL | Status: AC

## 2023-04-21 MED ORDER — DEXMEDETOMIDINE HCL IN NACL 80 MCG/20ML IV SOLN
INTRAVENOUS | Status: DC | PRN
Start: 2023-04-21 — End: 2023-04-21
  Administered 2023-04-21: 8 ug via INTRAVENOUS
  Administered 2023-04-21 (×2): 4 ug via INTRAVENOUS

## 2023-04-21 MED ORDER — PHENYLEPHRINE 80 MCG/ML (10ML) SYRINGE FOR IV PUSH (FOR BLOOD PRESSURE SUPPORT)
PREFILLED_SYRINGE | INTRAVENOUS | Status: AC
  Filled 2023-04-21: qty 10

## 2023-04-21 MED ORDER — 0.9 % SODIUM CHLORIDE (POUR BTL) OPTIME
TOPICAL | Status: DC | PRN
  Administered 2023-04-21: 500 mL

## 2023-04-21 MED ORDER — PHENYLEPHRINE 80 MCG/ML (10ML) SYRINGE FOR IV PUSH (FOR BLOOD PRESSURE SUPPORT)
PREFILLED_SYRINGE | INTRAVENOUS | Status: DC | PRN
  Administered 2023-04-21: 80 ug via INTRAVENOUS
  Administered 2023-04-21: 160 ug via INTRAVENOUS
  Administered 2023-04-21 (×2): 80 ug via INTRAVENOUS
  Administered 2023-04-21: 160 ug via INTRAVENOUS
  Administered 2023-04-21: 80 ug via INTRAVENOUS

## 2023-04-21 MED ORDER — LACTATED RINGERS IV SOLN
INTRAVENOUS | Status: DC | PRN
Start: 2023-04-21 — End: 2023-04-21

## 2023-04-21 MED ORDER — SUCCINYLCHOLINE CHLORIDE 200 MG/10ML IV SOSY
PREFILLED_SYRINGE | INTRAVENOUS | Status: DC | PRN
  Administered 2023-04-21: 140 mg via INTRAVENOUS

## 2023-04-21 MED ORDER — ONDANSETRON HCL 4 MG/2ML IJ SOLN: 4.0000 mg | Freq: Once | INTRAMUSCULAR | Status: DC | PRN

## 2023-04-21 MED ORDER — CHLORHEXIDINE GLUCONATE 0.12 % MT SOLN
15.0000 mL | Freq: Once | OROMUCOSAL | Status: AC
  Administered 2023-04-21: 15 mL via OROMUCOSAL

## 2023-04-21 MED ORDER — ROCURONIUM BROMIDE 100 MG/10ML IV SOLN
INTRAVENOUS | Status: DC | PRN
  Administered 2023-04-21: 40 mg via INTRAVENOUS
  Administered 2023-04-21: 10 mg via INTRAVENOUS
  Administered 2023-04-21 (×3): 20 mg via INTRAVENOUS

## 2023-04-21 MED ORDER — METRONIDAZOLE 500 MG/100ML IV SOLN
500.0000 mg | Freq: Three times a day (TID) | INTRAVENOUS | Status: DC
  Filled 2023-04-21: qty 100

## 2023-04-21 MED ORDER — PROPOFOL 10 MG/ML IV BOLUS
INTRAVENOUS | Status: DC | PRN
  Administered 2023-04-21: 200 mg via INTRAVENOUS

## 2023-04-21 MED ORDER — METOPROLOL TARTRATE 5 MG/5ML IV SOLN: 5.0000 mg | Freq: Three times a day (TID) | INTRAVENOUS | Status: DC

## 2023-04-21 MED ORDER — BUPIVACAINE LIPOSOME 1.3 % IJ SUSP
INTRAMUSCULAR | Status: AC
  Filled 2023-04-21: qty 10

## 2023-04-21 MED ORDER — ACETAMINOPHEN 500 MG PO TABS
1000.0000 mg | ORAL_TABLET | Freq: Four times a day (QID) | ORAL | Status: DC
  Administered 2023-04-21 – 2023-04-24 (×12): 1000 mg via ORAL
  Filled 2023-04-21 (×20): qty 2

## 2023-04-21 MED ORDER — METRONIDAZOLE 500 MG/100ML IV SOLN
500.0000 mg | Freq: Once | INTRAVENOUS | Status: AC
  Administered 2023-04-21: 500 mg via INTRAVENOUS
  Filled 2023-04-21: qty 100

## 2023-04-21 MED ORDER — PHENYLEPHRINE HCL-NACL 20-0.9 MG/250ML-% IV SOLN
INTRAVENOUS | Status: AC
  Filled 2023-04-21: qty 250

## 2023-04-21 MED ORDER — IOHEXOL 350 MG/ML SOLN
100.0000 mL | Freq: Once | INTRAVENOUS | Status: AC | PRN
  Administered 2023-04-21: 100 mL via INTRAVENOUS

## 2023-04-21 MED ORDER — FENTANYL CITRATE (PF) 100 MCG/2ML IJ SOLN
INTRAMUSCULAR | Status: DC | PRN
  Administered 2023-04-21: 100 ug via INTRAVENOUS
  Administered 2023-04-21 (×2): 50 ug via INTRAVENOUS

## 2023-04-21 MED ORDER — SODIUM CHLORIDE 0.9 % IV SOLN
1.0000 g | Freq: Once | INTRAVENOUS | Status: AC
  Administered 2023-04-21: 1 g via INTRAVENOUS
  Filled 2023-04-21: qty 10

## 2023-04-21 MED ORDER — LIDOCAINE HCL (PF) 2 % IJ SOLN
INTRAMUSCULAR | Status: AC
  Filled 2023-04-21: qty 5

## 2023-04-21 MED ORDER — CEFAZOLIN SODIUM-DEXTROSE 1-4 GM/50ML-% IV SOLN
INTRAVENOUS | Status: DC | PRN
Start: 2023-04-21 — End: 2023-04-21
  Administered 2023-04-21: 3 g via INTRAVENOUS

## 2023-04-21 MED ORDER — LIDOCAINE HCL (CARDIAC) PF 100 MG/5ML IV SOSY
PREFILLED_SYRINGE | INTRAVENOUS | Status: DC | PRN
  Administered 2023-04-21: 100 mg via INTRAVENOUS

## 2023-04-21 MED ORDER — PIPERACILLIN-TAZOBACTAM 3.375 G IVPB
3.3750 g | Freq: Three times a day (TID) | INTRAVENOUS | Status: AC
  Administered 2023-04-21 – 2023-04-22 (×3): 3.375 g via INTRAVENOUS
  Filled 2023-04-21 (×3): qty 50

## 2023-04-21 MED ORDER — SODIUM CHLORIDE 0.9 % IV SOLN: INTRAVENOUS | Status: DC

## 2023-04-21 MED ORDER — SODIUM CHLORIDE 0.9 % IV SOLN: 2.0000 g | INTRAVENOUS | Status: DC

## 2023-04-21 MED ORDER — ACETAMINOPHEN 10 MG/ML IV SOLN
INTRAVENOUS | Status: AC
  Filled 2023-04-21: qty 100

## 2023-04-21 MED ORDER — SUGAMMADEX SODIUM 500 MG/5ML IV SOLN
INTRAVENOUS | Status: DC | PRN
  Administered 2023-04-21: 400 mg via INTRAVENOUS

## 2023-04-21 MED ORDER — BUPIVACAINE-EPINEPHRINE (PF) 0.5% -1:200000 IJ SOLN
INTRAMUSCULAR | Status: AC
  Filled 2023-04-21: qty 10

## 2023-04-21 MED ORDER — METHOCARBAMOL 1000 MG/10ML IJ SOLN
500.0000 mg | Freq: Three times a day (TID) | INTRAVENOUS | Status: DC
  Administered 2023-04-21 – 2023-05-01 (×30): 500 mg via INTRAVENOUS
  Filled 2023-04-21 (×3): qty 500
  Filled 2023-04-21 (×3): qty 5
  Filled 2023-04-21 (×3): qty 500
  Filled 2023-04-21: qty 5
  Filled 2023-04-21: qty 500
  Filled 2023-04-21 (×4): qty 5
  Filled 2023-04-21 (×2): qty 500
  Filled 2023-04-21 (×3): qty 5
  Filled 2023-04-21: qty 500
  Filled 2023-04-21 (×4): qty 5
  Filled 2023-04-21 (×4): qty 500
  Filled 2023-04-21 (×3): qty 5
  Filled 2023-04-21: qty 500
  Filled 2023-04-21: qty 5
  Filled 2023-04-21 (×2): qty 500

## 2023-04-21 MED ORDER — MORPHINE SULFATE (PF) 2 MG/ML IV SOLN
2.0000 mg | INTRAVENOUS | Status: DC | PRN
  Administered 2023-04-21 – 2023-04-25 (×4): 2 mg via INTRAVENOUS
  Filled 2023-04-21 (×4): qty 1

## 2023-04-21 MED ORDER — SUCCINYLCHOLINE CHLORIDE 200 MG/10ML IV SOSY
PREFILLED_SYRINGE | INTRAVENOUS | Status: AC
  Filled 2023-04-21: qty 10

## 2023-04-21 MED ORDER — ACETAMINOPHEN 10 MG/ML IV SOLN
INTRAVENOUS | Status: AC
  Filled 2023-04-21: qty 300

## 2023-04-21 MED ORDER — OXYCODONE HCL 5 MG PO TABS
5.0000 mg | ORAL_TABLET | ORAL | Status: DC | PRN
  Administered 2023-04-22 – 2023-04-23 (×5): 10 mg via ORAL
  Filled 2023-04-21 (×8): qty 2

## 2023-04-21 SURGICAL SUPPLY — 48 items
ADH SKN CLS APL DERMABOND .7 (GAUZE/BANDAGES/DRESSINGS) ×1
APPLIER CLIP ROT 10 11.4 M/L (STAPLE)
APR CLP MED LRG 11.4X10 (STAPLE)
CLIP APPLIE ROT 10 11.4 M/L (STAPLE) IMPLANT
CORD MONOPOLAR M/FML 12FT (MISCELLANEOUS) ×1 IMPLANT
CUTTER FLEX LINEAR 45M (STAPLE) IMPLANT
DERMABOND ADVANCED .7 DNX12 (GAUZE/BANDAGES/DRESSINGS) ×1 IMPLANT
DRSG TELFA 3X8 NADH STRL (GAUZE/BANDAGES/DRESSINGS) IMPLANT
ELECT REM PT RETURN 9FT ADLT (ELECTROSURGICAL) ×1
ELECTRODE REM PT RTRN 9FT ADLT (ELECTROSURGICAL) ×1 IMPLANT
GLOVE BIOGEL PI IND STRL 7.5 (GLOVE) ×1 IMPLANT
GLOVE SURG SYN 7.0 (GLOVE) ×1 IMPLANT
GLOVE SURG SYN 7.0 PF PI (GLOVE) ×1 IMPLANT
GOWN STRL REUS W/ TWL LRG LVL3 (GOWN DISPOSABLE) ×2 IMPLANT
GOWN STRL REUS W/TWL LRG LVL3 (GOWN DISPOSABLE) ×2
GRASPER SUT TROCAR 14GX15 (MISCELLANEOUS) ×1 IMPLANT
HEMOSTAT SURGICEL 2X3 (HEMOSTASIS) IMPLANT
IRRIGATION STRYKERFLOW (MISCELLANEOUS) IMPLANT
IRRIGATOR STRYKERFLOW (MISCELLANEOUS) ×1
IV NS 1000ML (IV SOLUTION) ×2
IV NS 1000ML BAXH (IV SOLUTION) IMPLANT
KIT TURNOVER KIT A (KITS) ×1 IMPLANT
LIGASURE LAP MARYLAND 5MM 37CM (ELECTROSURGICAL) IMPLANT
MANIFOLD NEPTUNE II (INSTRUMENTS) ×1 IMPLANT
NDL HYPO 22X1.5 SAFETY MO (MISCELLANEOUS) ×1 IMPLANT
NEEDLE HYPO 22X1.5 SAFETY MO (MISCELLANEOUS) ×1 IMPLANT
NEEDLE VERESS 14GA 120MM (NEEDLE) ×1 IMPLANT
NS IRRIG 500ML POUR BTL (IV SOLUTION) ×1 IMPLANT
PACK LAP CHOLECYSTECTOMY (MISCELLANEOUS) ×1 IMPLANT
RELOAD 45 VASCULAR/THIN (ENDOMECHANICALS) ×1 IMPLANT
RELOAD STAPLE 45 2.5 WHT GRN (ENDOMECHANICALS) IMPLANT
RELOAD STAPLE 45 3.5 BLU ETS (ENDOMECHANICALS) IMPLANT
RELOAD STAPLE TA45 3.5 REG BLU (ENDOMECHANICALS) ×3 IMPLANT
SCISSORS METZENBAUM CVD 33 (INSTRUMENTS) ×1 IMPLANT
SET TUBE SMOKE EVAC HIGH FLOW (TUBING) ×1 IMPLANT
SLEEVE Z-THREAD 5X100MM (TROCAR) ×1 IMPLANT
SUT MNCRL AB 4-0 PS2 18 (SUTURE) ×1 IMPLANT
SUT VIC AB 0 SH 27 (SUTURE) ×1 IMPLANT
SUT VIC AB 3-0 SH 27 (SUTURE) ×1
SUT VIC AB 3-0 SH 27X BRD (SUTURE) IMPLANT
SUT VICRYL 0 AB UR-6 (SUTURE) IMPLANT
SYS BAG RETRIEVAL 10MM (BASKET) ×1
SYSTEM BAG RETRIEVAL 10MM (BASKET) ×1 IMPLANT
TRAP FLUID SMOKE EVACUATOR (MISCELLANEOUS) ×1 IMPLANT
TRAY FOLEY MTR SLVR 16FR STAT (SET/KITS/TRAYS/PACK) ×1 IMPLANT
TROCAR Z-THREAD FIOS 12X100MM (TROCAR) ×1 IMPLANT
TROCAR Z-THREAD FIOS 5X100MM (TROCAR) ×1 IMPLANT
WATER STERILE IRR 500ML POUR (IV SOLUTION) ×1 IMPLANT

## 2023-04-21 NOTE — Anesthesia Preprocedure Evaluation (Addendum)
Anesthesia Evaluation  Patient identified by MRN, date of birth, ID band Patient awake    Reviewed: Allergy & Precautions, NPO status , Patient's Chart, lab work & pertinent test results  Airway Mallampati: III  TM Distance: >3 FB Neck ROM: Full    Dental  (+) Teeth Intact, Caps   Pulmonary sleep apnea , Patient abstained from smoking., former smoker   Pulmonary exam normal breath sounds clear to auscultation       Cardiovascular Exercise Tolerance: Good hypertension, Pt. on medications Normal cardiovascular exam Rhythm:Regular Rate:Normal     Neuro/Psych    Depression    negative neurological ROS  negative psych ROS   GI/Hepatic negative GI ROS, Neg liver ROS,,,  Endo/Other  negative endocrine ROS  Morbid obesity  Renal/GU negative Renal ROS  negative genitourinary   Musculoskeletal   Abdominal  (+) + obese  Peds negative pediatric ROS (+)  Hematology negative hematology ROS (+)   Anesthesia Other Findings Past Medical History: No date: Allergy No date: Depression No date: Hypertension No date: Seasonal allergies  Past Surgical History: 08/09/2019: COLONOSCOPY WITH PROPOFOL; N/A     Comment:  Procedure: COLONOSCOPY WITH PROPOFOL;  Surgeon: Midge Minium, MD;  Location: [facility] ENDOSCOPY;  Service:               Endoscopy;  Laterality: N/A; 08/02/2022: COLONOSCOPY WITH PROPOFOL; N/A     Comment:  Procedure: COLONOSCOPY WITH PROPOFOL;  Surgeon: Midge Minium, MD;  Location: [facility] ENDOSCOPY;  Service:               Endoscopy;  Laterality: N/A; 11/10/2022: COLONOSCOPY WITH PROPOFOL; N/A     Comment:  Procedure: COLONOSCOPY WITH PROPOFOL;  Surgeon: Midge Minium, MD;  Location: [facility] ENDOSCOPY;  Service:               Endoscopy;  Laterality: N/A; No date: KNEE SURGERY; Left  BMI    Body Mass Index: 41.20 kg/m      Reproductive/Obstetrics negative OB ROS                               Anesthesia Physical Anesthesia Plan  ASA: 3  Anesthesia Plan: General   Post-op Pain Management:    Induction: Intravenous  PONV Risk Score and Plan: Ondansetron, Dexamethasone, Midazolam and Treatment may vary due to age or medical condition  Airway Management Planned: Oral ETT  Additional Equipment:   Intra-op Plan:   Post-operative Plan: Extubation in OR  Informed Consent: I have reviewed the patients History and Physical, chart, labs and discussed the procedure including the risks, benefits and alternatives for the proposed anesthesia with the patient or authorized representative who has indicated his/her understanding and acceptance.     Dental Advisory Given  Plan Discussed with: CRNA and Surgeon  Anesthesia Plan Comments:         Anesthesia Quick Evaluation

## 2023-04-21 NOTE — H&P (Signed)
CC: Abdominal Pain  HPI: 29M with PMH significnat for IBS; anxiety and HTN who presents with two day hx of abdominal pain. Pain initially vague and felt like he pulled a muscle in abdomen that transitioned to sharper right lower quadrant pain. This was accompanied by some diarrhea on Tuesday. He also says he has felt feverish. Denies dysuria. Last colonoscoipy this year and had polyps removed  PMH: IBS; anxiety, HTN  PSH: No prior abdominal surgery, knee surgery in 1990s  Social Hx: Denies tobacco use, has one sister  Family Hx: Reviewed and non-contributoryto encounter  Physical exam: Wt Readings from Last 3 Encounters:  04/20/23 (!) 170.1 kg  03/03/23 (!) 172.7 kg  11/10/22 (!) 169.2 kg   Temp Readings from Last 3 Encounters:  04/21/23 98.7 F (37.1 C) (Oral)  11/10/22 (!) 97.5 F (36.4 C) (Temporal)  08/18/22 98.2 F (36.8 C) (Oral)   BP Readings from Last 3 Encounters:  04/21/23 (!) 144/95  03/03/23 (!) 140/100  11/10/22 (!) 143/95   Pulse Readings from Last 3 Encounters:  04/21/23 68  11/10/22 76  08/18/22 98   Abdomen, soft, obese, tender to palpation in right lower quadrant without rebound tenderness or guarding No abdominal scars  Labs: reviewed no leukocytosis CT reviwed, inflammatory changes in RLQ consistent with appendicitis  A/P: 6m with two day history of abdominal pain that transitioned to right lower quadrant pain. CT and exam consistent with appendicitis  -to or for lap appy -discussed r/b/a of surgeyr including infection, bleeding, hernia, staple line leak -rocephin/flagyl -npo -prn pain control  Baker Pierini, M.D.

## 2023-04-21 NOTE — Brief Op Note (Signed)
04/21/2023  3:20 PM  PATIENT:  Karie Mainland.  47 y.o. male  PRE-OPERATIVE DIAGNOSIS:  Acute Appendicitis  POST-OPERATIVE DIAGNOSIS:  perforated appendix  PROCEDURE:  Procedure(s): APPENDECTOMY LAPAROSCOPIC, PARTIAL CECECTOMY (N/A)  SURGEON:  Surgeons and Role:    * Kandis Cocking, MD - Primary  PHYSICIAN ASSISTANT:   ASSISTANTS: Cynda Familia   ANESTHESIA:   general  EBL:  20 mL   BLOOD ADMINISTERED:none  DRAINS: none   LOCAL MEDICATIONS USED:  MARCAINE    and BUPIVICAINE   SPECIMEN:  Source of Specimen:  appendix, cecum  DISPOSITION OF SPECIMEN:  PATHOLOGY  COUNTS:  YES  TOURNIQUET:  * No tourniquets in log *  DICTATION: .Note written in EPIC  PLAN OF CARE: Admit for overnight observation  PATIENT DISPOSITION:  Short Stay   Delay start of Pharmacological VTE agent (>24hrs) due to surgical blood loss or risk of bleeding: yes

## 2023-04-21 NOTE — Anesthesia Procedure Notes (Signed)
Procedure Name: Intubation Date/Time: 04/21/2023 12:07 PM  Performed by: Elisabeth Pigeon, CRNAPre-anesthesia Checklist: Patient identified, Patient being monitored, Timeout performed, Emergency Drugs available and Suction available Patient Re-evaluated:Patient Re-evaluated prior to induction Oxygen Delivery Method: Circle system utilized Preoxygenation: Pre-oxygenation with 100% oxygen Induction Type: IV induction Ventilation: Mask ventilation without difficulty Laryngoscope Size: McGraph and 4 Grade View: Grade I Tube type: Oral Tube size: 8.0 mm Number of attempts: 1 Airway Equipment and Method: Stylet Placement Confirmation: ETT inserted through vocal cords under direct vision, positive ETCO2 and breath sounds checked- equal and bilateral Secured at: 21 cm Tube secured with: Tape Dental Injury: Teeth and Oropharynx as per pre-operative assessment

## 2023-04-21 NOTE — Transfer of Care (Signed)
Immediate Anesthesia Transfer of Care Note  Patient: Andrew Andersen.  Procedure(s) Performed: APPENDECTOMY LAPAROSCOPIC, PARTIAL CECECTOMY (Abdomen)  Patient Location: PACU  Anesthesia Type:General  Level of Consciousness: awake  Airway & Oxygen Therapy: Patient Spontanous Breathing and Patient connected to face mask oxygen  Post-op Assessment: Report given to RN and Post -op Vital signs reviewed and stable  Post vital signs: Reviewed and stable  Last Vitals:  Vitals Value Taken Time  BP 144/90 04/21/23 1515  Temp 36.2 C 04/21/23 1513  Pulse 94 04/21/23 1519  Resp 20 04/21/23 1519  SpO2 97 % 04/21/23 1519  Vitals shown include unfiled device data.  Last Pain:  Vitals:   04/21/23 1513  TempSrc:   PainSc: 0-No pain         Complications: No notable events documented.

## 2023-04-21 NOTE — ED Provider Notes (Signed)
[facility] Provider Note    Event Date/Time   First MD Initiated Contact with Patient 04/21/23 0014     (approximate)   History   Abdominal Pain   HPI  Andrew Andersen. is a 47 y.o. male   Past medical history of pretension presents emergency department with right lower quadrant pain.  Over the last 3 days he developed first periumbilical pain that migrated to the right lower quadrant.  No fevers no chills no urinary symptoms.  No history abdominal surgeries.  External Medical Documents Reviewed: Colonoscopy performed by Dr. Daleen Squibb in April 2024 largely unremarkable with some polyps, diverticulosis, and hemorrhoids      Physical Exam   Triage Vital Signs: ED Triage Vitals  Encounter Vitals Group     BP 04/20/23 2306 (!) 148/105     Systolic BP Percentile --      Diastolic BP Percentile --      Pulse Rate 04/20/23 2306 61     Resp 04/20/23 2306 18     Temp 04/20/23 2306 99 F (37.2 C)     Temp Source 04/20/23 2306 Oral     SpO2 04/20/23 2306 98 %     Weight 04/20/23 2306 (!) 375 lb (170.1 kg)     Height 04/20/23 2306 6\' 8"  (2.032 m)     Head Circumference --      Peak Flow --      Pain Score 04/20/23 2309 5     Pain Loc --      Pain Education --      Exclude from Growth Chart --     Most recent vital signs: Vitals:   04/20/23 2306  BP: (!) 148/105  Pulse: 61  Resp: 18  Temp: 99 F (37.2 C)  SpO2: 98%    General: Awake, no distress.  CV:  Good peripheral perfusion.  Resp:  Normal effort.  Abd:  No distention.  Other:  Right lower quadrant tenderness without rigidity or guarding, hypertensive otherwise vital signs normal and afebrile.   ED Results / Procedures / Treatments   Labs (all labs ordered are listed, but only abnormal results are displayed) Labs Reviewed  COMPREHENSIVE METABOLIC PANEL - Abnormal; Notable for the following components:      Result Value   Potassium 3.2 (*)    Glucose, Bld 113 (*)    Calcium 8.8  (*)    All other components within normal limits  URINALYSIS, ROUTINE W REFLEX MICROSCOPIC - Abnormal; Notable for the following components:   Color, Urine YELLOW (*)    APPearance CLEAR (*)    Specific Gravity, Urine >1.046 (*)    All other components within normal limits  LIPASE, BLOOD  CBC     I ordered and reviewed the above labs they are notable for no leukocytosis and normal H&H  RADIOLOGY I independently reviewed and interpreted CT of the abdomen pelvis and see inflammatory fat stranding changes in the right lower quadrant concerning for appendicitis I also reviewed radiologist's formal read.   PROCEDURES:  Critical Care performed: No  Procedures   MEDICATIONS ORDERED IN ED: Medications  metroNIDAZOLE (FLAGYL) IVPB 500 mg (500 mg Intravenous New Bag/Given 04/21/23 0405)  iohexol (OMNIPAQUE) 350 MG/ML injection 100 mL (100 mLs Intravenous Contrast Given 04/21/23 0110)  cefTRIAXone (ROCEPHIN) 1 g in sodium chloride 0.9 % 100 mL IVPB (1 g Intravenous New Bag/Given 04/21/23 0404)    External physician / consultants:  I spoke with Dr. Maurine Minister of general  surgery regarding care plan for this patient.   IMPRESSION / MDM / ASSESSMENT AND PLAN / ED COURSE  I reviewed the triage vital signs and the nursing notes.                                Patient's presentation is most consistent with acute presentation with potential threat to life or bodily function.  Differential diagnosis includes, but is not limited to, acute appendicitis, IBS flare, gastroenteritis, cholecystitis, diverticulitis   The patient is on the cardiac monitor to evaluate for evidence of arrhythmia and/or significant heart rate changes.  MDM:    Patient with concern for appendicitis and indeed he does have acute uncomplicated appendicitis verified on CT scan.  Started on IV antibiotics, notified Dr. Maurine Minister of general surgery for admission and surgery.  Doubt sepsis given no vital sign instability  leukocytosis fever.        FINAL CLINICAL IMPRESSION(S) / ED DIAGNOSES   Final diagnoses:  Other acute appendicitis     Rx / DC Orders   ED Discharge Orders     None        Note:  This document was prepared using Dragon voice recognition software and may include unintentional dictation errors.    Pilar Jarvis, MD 04/21/23 [id number]

## 2023-04-22 LAB — CBC WITH DIFFERENTIAL/PLATELET
Abs Immature Granulocytes: 0.02 10*3/uL (ref 0.00–0.07)
Basophils Absolute: 0 10*3/uL (ref 0.0–0.1)
Basophils Relative: 0 %
Eosinophils Absolute: 0 10*3/uL (ref 0.0–0.5)
Eosinophils Relative: 0 %
HCT: 41.1 % (ref 39.0–52.0)
Hemoglobin: 13.3 g/dL (ref 13.0–17.0)
Immature Granulocytes: 0 %
Lymphocytes Relative: 6 %
Lymphs Abs: 0.5 10*3/uL — ABNORMAL LOW (ref 0.7–4.0)
MCH: 29.3 pg (ref 26.0–34.0)
MCHC: 32.4 g/dL (ref 30.0–36.0)
MCV: 90.5 fL (ref 80.0–100.0)
Monocytes Absolute: 1.4 10*3/uL — ABNORMAL HIGH (ref 0.1–1.0)
Monocytes Relative: 16 %
Neutro Abs: 6.6 10*3/uL (ref 1.7–7.7)
Neutrophils Relative %: 78 %
Platelets: 160 10*3/uL (ref 150–400)
RBC: 4.54 MIL/uL (ref 4.22–5.81)
RDW: 14.3 % (ref 11.5–15.5)
WBC: 8.5 10*3/uL (ref 4.0–10.5)
nRBC: 0 % (ref 0.0–0.2)

## 2023-04-22 LAB — BASIC METABOLIC PANEL
Anion gap: 10 (ref 5–15)
BUN: 16 mg/dL (ref 6–20)
CO2: 24 mmol/L (ref 22–32)
Calcium: 8.5 mg/dL — ABNORMAL LOW (ref 8.9–10.3)
Chloride: 106 mmol/L (ref 98–111)
Creatinine, Ser: 1.17 mg/dL (ref 0.61–1.24)
GFR, Estimated: >60 mL/min (ref >60)
Glucose, Bld: 150 mg/dL — ABNORMAL HIGH (ref 70–99)
Potassium: 3.6 mmol/L (ref 3.5–5.1)
Sodium: 140 mmol/L (ref 135–145)

## 2023-04-22 NOTE — Progress Notes (Signed)
Notified by RN about patient having difficulty sitting and standing due to pain.  He reports pain is mostly unchanged since this am.  Pain worsens with movement but ok while laying still. Denies any N/V.  Exam consistent with focal TTP RLQ and incision sites, as expected. No guarding or signs of diffuse peritonitis.  I explained to patient pain likely still is postoperative in nature due to his complicated appendicitis.  He is able to tolerate full hip flexion in bed without any issues.   Recommend he continue to mobilize as tolerated.  SCDs while in bed.  I personally placed SCDs on patient after exam.   He verbalized understanding and case discussed with RN as well.

## 2023-04-22 NOTE — Progress Notes (Signed)
Mobility Specialist - Progress Note     04/22/23 1300  Mobility  Level of Assistance Modified independent, requires aide device or extra time  Assistive Device Front wheel walker  Mobility Referral Yes  $Mobility charge 1 Mobility  Mobility Specialist Start Time (ACUTE ONLY) 1200  Mobility Specialist Stop Time (ACUTE ONLY) 1216  Mobility Specialist Time Calculation (min) (ACUTE ONLY) 16 min   Pt resting in bed on 2L upon entry. PT endorses 4/10 pain in laying and increased pain with movement to EOB x2 attempts; endorsed dizziness. Dizziness subsided and Pt STS with RW for 1 minute before dizziness returned. (RN Notified) Pt returned to bed and left with needs in reach. RN present in room

## 2023-04-22 NOTE — Plan of Care (Signed)
  Problem: Education: Goal: Knowledge of General Education information will improve Description: Including pain rating scale, medication(s)/side effects and non-pharmacologic comfort measures Outcome: Progressing   Problem: Activity: Goal: Risk for activity intolerance will decrease Outcome: Progressing   Problem: Pain Managment: Goal: General experience of comfort will improve Outcome: Progressing   

## 2023-04-23 ENCOUNTER — Encounter: Payer: Self-pay | Admitting: General Surgery

## 2023-04-23 DIAGNOSIS — K589 Irritable bowel syndrome without diarrhea: Secondary | ICD-10-CM | POA: Diagnosis present

## 2023-04-23 DIAGNOSIS — F419 Anxiety disorder, unspecified: Secondary | ICD-10-CM | POA: Diagnosis present

## 2023-04-23 DIAGNOSIS — Y838 Other surgical procedures as the cause of abnormal reaction of the patient, or of later complication, without mention of misadventure at the time of the procedure: Secondary | ICD-10-CM | POA: Diagnosis not present

## 2023-04-23 DIAGNOSIS — K3532 Acute appendicitis with perforation and localized peritonitis, without abscess: Secondary | ICD-10-CM | POA: Diagnosis present

## 2023-04-23 DIAGNOSIS — R188 Other ascites: Secondary | ICD-10-CM | POA: Diagnosis not present

## 2023-04-23 DIAGNOSIS — Z87891 Personal history of nicotine dependence: Secondary | ICD-10-CM | POA: Diagnosis not present

## 2023-04-23 DIAGNOSIS — Z820 Family history of epilepsy and other diseases of the nervous system: Secondary | ICD-10-CM | POA: Diagnosis not present

## 2023-04-23 DIAGNOSIS — F32A Depression, unspecified: Secondary | ICD-10-CM | POA: Diagnosis present

## 2023-04-23 DIAGNOSIS — Z8249 Family history of ischemic heart disease and other diseases of the circulatory system: Secondary | ICD-10-CM | POA: Diagnosis not present

## 2023-04-23 DIAGNOSIS — K3589 Other acute appendicitis without perforation or gangrene: Secondary | ICD-10-CM | POA: Diagnosis present

## 2023-04-23 DIAGNOSIS — Z79899 Other long term (current) drug therapy: Secondary | ICD-10-CM | POA: Diagnosis not present

## 2023-04-23 DIAGNOSIS — K567 Ileus, unspecified: Secondary | ICD-10-CM | POA: Diagnosis not present

## 2023-04-23 DIAGNOSIS — I10 Essential (primary) hypertension: Secondary | ICD-10-CM | POA: Diagnosis present

## 2023-04-23 DIAGNOSIS — Z6841 Body Mass Index (BMI) 40.0 and over, adult: Secondary | ICD-10-CM | POA: Diagnosis not present

## 2023-04-23 DIAGNOSIS — K9189 Other postprocedural complications and disorders of digestive system: Secondary | ICD-10-CM | POA: Diagnosis present

## 2023-04-23 MED ORDER — ENOXAPARIN SODIUM 100 MG/ML IJ SOSY
0.5000 mg/kg | PREFILLED_SYRINGE | INTRAMUSCULAR | Status: DC
  Administered 2023-04-24 – 2023-04-26 (×3): 85 mg via SUBCUTANEOUS
  Filled 2023-04-23 (×3): qty 1

## 2023-04-23 MED ORDER — PIPERACILLIN-TAZOBACTAM 3.375 G IVPB
3.3750 g | Freq: Three times a day (TID) | INTRAVENOUS | Status: DC
  Administered 2023-04-23 – 2023-04-28 (×14): 3.375 g via INTRAVENOUS
  Filled 2023-04-23 (×14): qty 50

## 2023-04-23 MED ORDER — SODIUM CHLORIDE 0.9 % IV SOLN: INTRAVENOUS | Status: DC | PRN

## 2023-04-23 NOTE — Plan of Care (Signed)

## 2023-04-23 NOTE — Anesthesia Postprocedure Evaluation (Signed)
Anesthesia Post Note  Patient: Andrew Andersen.  Procedure(s) Performed: APPENDECTOMY LAPAROSCOPIC, PARTIAL CECECTOMY (Abdomen)  Patient location during evaluation: PACU Anesthesia Type: General Level of consciousness: awake and alert, oriented and patient cooperative Pain management: pain level controlled Vital Signs Assessment: post-procedure vital signs reviewed and stable Respiratory status: spontaneous breathing, nonlabored ventilation and respiratory function stable Cardiovascular status: blood pressure returned to baseline and stable Postop Assessment: adequate PO intake Anesthetic complications: no   No notable events documented.   Last Vitals:  Vitals:   04/23/23 0455 04/23/23 0812  BP: 124/86 (!) 126/91  Pulse: (!) 110 (!) 110  Resp: 18 18  Temp:  36.8 C  SpO2: 94% 94%    Last Pain:  Vitals:   04/23/23 0812  TempSrc: Oral  PainSc:                  Reed Breech

## 2023-04-23 NOTE — Plan of Care (Signed)
  Problem: Pain Managment: Goal: General experience of comfort will improve Outcome: Progressing   Problem: Activity: Goal: Risk for activity intolerance will decrease Outcome: Progressing   

## 2023-04-23 NOTE — Progress Notes (Signed)
Subjective:  CC: Andrew Andersen. is a 47 y.o. male  Hospital stay day 0, 2 Days Post-Op laparoscopic appendectomy for perforated appendicitis  HPI: No acute issues overnight.  Passing flatus, no BM.  Pain with slight improvement, able to ambulate in halls today. Does mention some fullness with diet.  ROS:  General: Denies weight loss, weight gain, fatigue, fevers, chills, and night sweats. Heart: Denies chest pain, palpitations, racing heart, irregular heartbeat, leg pain or swelling, and decreased activity tolerance. Respiratory: Denies breathing difficulty, shortness of breath, wheezing, cough, and sputum. GI: Denies change in appetite, heartburn, nausea, vomiting, constipation, diarrhea, and blood in stool. GU: Denies difficulty urinating, pain with urinating, urgency, frequency, blood in urine.   Objective:   Temp:  [98.1 F (36.7 C)-98.2 F (36.8 C)] 98.2 F (36.8 C) (09/29 0812) Pulse Rate:  [96-110] 96 (09/29 1627) Resp:  [18-19] 18 (09/29 1627) BP: (114-133)/(82-91) 133/82 (09/29 1627) SpO2:  [94 %-96 %] 96 % (09/29 1627)     Height: 6\' 8"  (203.2 cm) Weight: (!) 170.1 kg BMI (Calculated): 41.2   Intake/Output this shift:   Intake/Output Summary (Last 24 hours) at 04/23/2023 1656 Last data filed at 04/23/2023 0545 Gross per 24 hour  Intake 110 ml  Output 550 ml  Net -440 ml    Constitutional :  alert, cooperative, appears stated age, and no distress  Respiratory:  clear to auscultation bilaterally  Cardiovascular:  regular rate and rhythm  Gastrointestinal: Soft, no guarding, TTP around incisions and RLQ, as expected. No overt distention .   Skin: Cool and moist.   Psychiatric: Normal affect, non-agitated, not confused       LABS:     Latest Ref Rng & Units 04/22/2023    5:23 AM 04/20/2023   11:07 PM 03/03/2023    1:43 PM  CMP  Glucose 70 - 99 mg/dL 098  119  90   BUN 6 - 20 mg/dL 16  15  11    Creatinine 0.61 - 1.24 mg/dL 1.47  8.29  5.62   Sodium 135 - 145  mmol/L 140  137  142   Potassium 3.5 - 5.1 mmol/L 3.6  3.2  4.1   Chloride 98 - 111 mmol/L 106  100  106   CO2 22 - 32 mmol/L 24  24  17    Calcium 8.9 - 10.3 mg/dL 8.5  8.8  9.0   Total Protein 6.5 - 8.1 g/dL  7.2  6.7   Total Bilirubin 0.3 - 1.2 mg/dL  1.2  0.6   Alkaline Phos 38 - 126 U/L  52  60   AST 15 - 41 U/L  21  24   ALT 0 - 44 U/L  31  30       Latest Ref Rng & Units 04/22/2023    5:23 AM 04/20/2023   11:07 PM 03/03/2023    1:43 PM  CBC  WBC 4.0 - 10.5 K/uL 8.5  8.8  6.6   Hemoglobin 13.0 - 17.0 g/dL 13.0  86.5  78.4   Hematocrit 39.0 - 52.0 % 41.1  39.9  40.5   Platelets 150 - 400 K/uL 160  182  191     RADS: N/a Assessment:   S/p laparoscopic appendectomy for perforated appendicitis  Pain improving, labs remain stable.  Some fullness reported with clears, so will stay with clears for now.  Encouraged ambulation and gum/hard candy.  labs/images/medications/previous chart entries reviewed personally and relevant changes/updates noted above.

## 2023-04-23 NOTE — Progress Notes (Signed)
Mobility Specialist - Progress Note     04/23/23 1358  Mobility  Activity Ambulated independently in hallway;Stood at bedside  Level of Assistance Independent after set-up  Assistive Device Front wheel walker  Distance Ambulated (ft) 240 ft  Range of Motion/Exercises Active  Activity Response Tolerated well  Mobility Referral Yes  $Mobility charge 1 Mobility  Mobility Specialist Start Time (ACUTE ONLY) 1345  Mobility Specialist Stop Time (ACUTE ONLY) 1401  Mobility Specialist Time Calculation (min) (ACUTE ONLY) 16 min   Pt resting in bed on RA upon entry. Pt STS and ambulates to hallway around NS Indep. After setup with IV pole hold assistance and chair follow for safety. Pt returned to bed and left with needs in reach.   Andrew Andersen Mobility Specialist 04/23/23, 2:12 PM

## 2023-04-24 LAB — CBC WITH DIFFERENTIAL/PLATELET
Abs Immature Granulocytes: 0.06 10*3/uL (ref 0.00–0.07)
Basophils Absolute: 0 10*3/uL (ref 0.0–0.1)
Basophils Relative: 0 %
Eosinophils Absolute: 0.2 10*3/uL (ref 0.0–0.5)
Eosinophils Relative: 2 %
HCT: 38.5 % — ABNORMAL LOW (ref 39.0–52.0)
Hemoglobin: 13 g/dL (ref 13.0–17.0)
Immature Granulocytes: 1 %
Lymphocytes Relative: 8 %
Lymphs Abs: 0.8 10*3/uL (ref 0.7–4.0)
MCH: 30 pg (ref 26.0–34.0)
MCHC: 33.8 g/dL (ref 30.0–36.0)
MCV: 88.7 fL (ref 80.0–100.0)
Monocytes Absolute: 1.1 10*3/uL — ABNORMAL HIGH (ref 0.1–1.0)
Monocytes Relative: 11 %
Neutro Abs: 7.9 10*3/uL — ABNORMAL HIGH (ref 1.7–7.7)
Neutrophils Relative %: 78 %
Platelets: 236 10*3/uL (ref 150–400)
RBC: 4.34 MIL/uL (ref 4.22–5.81)
RDW: 14.6 % (ref 11.5–15.5)
WBC: 10 10*3/uL (ref 4.0–10.5)
nRBC: 0 % (ref 0.0–0.2)

## 2023-04-24 MED ORDER — SERTRALINE HCL 50 MG PO TABS
100.0000 mg | ORAL_TABLET | Freq: Every day | ORAL | Status: DC
  Administered 2023-04-24 – 2023-05-01 (×5): 100 mg via ORAL
  Filled 2023-04-24 (×6): qty 2

## 2023-04-24 NOTE — Plan of Care (Signed)

## 2023-04-24 NOTE — Op Note (Addendum)
Preop diagnosis: Acute appendicitis Postop diagnosis: Acute perforated appendicitis Procedure: Laparoscopic appendectomy and partial cecectomy Case type: 30 EBL: 20 cc Surgeon: Baker Pierini, MD Assistant: Lynden Oxford, PA-C  Indication: The patient presented to the emergency department with 2 and half day history of abdominal pain and right lower quadrant pain.  He had a CT scan that showed acute appendicitis was taken to the operating room for laparoscopic appendectomy.  Details: After informed consent was obtained the patient was brought to the operating room and placed supine on the operating room table.  General endotracheal anesthesia was induced.  A Foley catheter was placed and his abdomen was then prepped and draped in the usual sterile fashion.  A surgical timeout was called identifying correct patient, site, side and procedure.  An incision was made at Palmer's point and the Veress needle was inserted into the abdomen and pneumoperitoneum was established.  A 5 mm Optiview trocar was placed just inferior to the umbilicus under direct visualization.  There was not any injury at the site of Veress needle insertion.  An additional 12 mm port was placed in the left lower quadrant and a 5 mm suprapubic port was placed under direct visualization.  The small bowel was swept out of the right lower quadrant revealing an indurated and inflamed appendix.  There was some pericolonic fat that was adhered to the base of the appendix.  The base of the appendix was separated from this fat and there was noted to be a perforation with frank stool right at the base of the appendix.  The appendix was lifted and a window was able to be made at the base of the appendix.  We were able to transect the base with a 45 mm blue load stapler.  However, given that the perforation was so proximal the staple line did include part of the cecum.  To complete the partial cecectomy pericolonic fat was further cleared from the  cecum.  The lateral corner of the cecum was then taken with another load of the 45 mm blue stapler.  Throughout this dissection we ensured that we did not compromise the ileocecal valve.  The appendix was then lifted and the mesoappendix was quite indurated.  During this dissection the distal appendix was separated from the proximal appendix where it had perforated.  The mesoappendix was then taken with ligasure device. Our cecal staple line was examined and I oversewed the lateral corner of this with a 2-0 vicryl. Hemostasis was obtained using several 5mm clips.  A piece of surgicel was placed over the mesoappendix staple line. The right lower quadrant was then copiously irrigated with warm saline solution. The appendix and partial cecectomy specimens were placed in endocatch bags, removed and passed off the table as specimen. The 12mm port fascia was closed with 0 vicryl on a suture needle passer. Exaparel-marcaine solution was then used to anesthetize the pre-peritoneal space and subcutaneous tissue. The skin was then closed with 4-0 monocryl.  Prior to closure all sponge and instrument counts were correct x 2.  The patient was then awoken from general endotracheal anesthesia and transferred to the PACU in stable condition.  Lynden Oxford PA-C was needed throughout the case for assistance and camera manipulation and visualization as well as closure.  Baker Pierini, M.D.

## 2023-04-24 NOTE — Progress Notes (Signed)
[facility] SURGICAL PROGRESS NOTE  Hospital Day(s): 1.   Post op day(s): 3 Days Post-Op.   Interval History:  Patient seen and examined No acute events or new complaints overnight.  Patient reports he is doing okay Still with feeling of fullness No fever, chills, nausea, emesis He remains without leukocytosis; WBC 10.0K Hgb to 13.0; stable He is on CLD; tolerating Reports flatus   Vital signs in last 24 hours: [min-max] current  Temp:  [98.2 F (36.8 C)-99.4 F (37.4 C)] 98.4 F (36.9 C) (09/30 0328) Pulse Rate:  [96-110] 101 (09/30 0328) Resp:  [18] 18 (09/30 0328) BP: (126-146)/(82-99) 146/94 (09/30 0328) SpO2:  [94 %-98 %] 95 % (09/30 0328)     Height: 6\' 8"  (203.2 cm) Weight: (!) 170.1 kg BMI (Calculated): 41.2   Intake/Output last 2 shifts:  09/29 0701 - 09/30 0700 In: 171.7 [I.V.:11.8; IV Piggyback:159.9] Out: 700 [Urine:700]   Physical Exam:  Constitutional: alert, cooperative and no distress  Respiratory: breathing non-labored at rest  Cardiovascular: regular rate and sinus rhythm  Gastrointestinal: soft, non-tender, difficult to assess distension given habitus, no rebound/guarding Integumentary: Laparoscopic incisions are CDI with dermabond, no erythema or drinage  Labs:     Latest Ref Rng & Units 04/24/2023    4:51 AM 04/22/2023    5:23 AM 04/20/2023   11:07 PM  CBC  WBC 4.0 - 10.5 K/uL 10.0  8.5  8.8   Hemoglobin 13.0 - 17.0 g/dL 21.3  08.6  57.8   Hematocrit 39.0 - 52.0 % 38.5  41.1  39.9   Platelets 150 - 400 K/uL 236  160  182       Latest Ref Rng & Units 04/22/2023    5:23 AM 04/20/2023   11:07 PM 03/03/2023    1:43 PM  CMP  Glucose 70 - 99 mg/dL 469  629  90   BUN 6 - 20 mg/dL 16  15  11    Creatinine 0.61 - 1.24 mg/dL 5.28  4.13  2.44   Sodium 135 - 145 mmol/L 140  137  142   Potassium 3.5 - 5.1 mmol/L 3.6  3.2  4.1   Chloride 98 - 111 mmol/L 106  100  106   CO2 22 - 32 mmol/L 24  24  17    Calcium 8.9 - 10.3 mg/dL 8.5  8.8   9.0   Total Protein 6.5 - 8.1 g/dL  7.2  6.7   Total Bilirubin 0.3 - 1.2 mg/dL  1.2  0.6   Alkaline Phos 38 - 126 U/L  52  60   AST 15 - 41 U/L  21  24   ALT 0 - 44 U/L  31  30      Imaging studies: No new pertinent imaging studies   Assessment/Plan: 47 y.o. male with likely post-operative ileus 3 Days Post-Op s/p laparoscopic appendectomy for perforated appendicitis   - Will trial full liquid diet with caution    - Monitor abdominal examination; on-going bowel function - Pain control prn; antiemetics prn   - Mobilize as tolerated   - Discharge Planning; Likely with some degree of ileus but doing well. Will slowly advance diet. Hopefully ready for DC in 24-48 hours but will take our time.    All of the above findings and recommendations were discussed with the patient, and the medical team, and all of patient's questions were answered to his expressed satisfaction.  -- Lynden Oxford, PA-C [facility] 04/24/2023, 7:11 AM M-F:  7am - 4pm

## 2023-04-25 ENCOUNTER — Telehealth: Payer: Self-pay | Admitting: Gastroenterology

## 2023-04-25 ENCOUNTER — Inpatient Hospital Stay: Payer: BC Managed Care – PPO

## 2023-04-25 LAB — CBC WITH DIFFERENTIAL/PLATELET
Abs Immature Granulocytes: 0.06 10*3/uL (ref 0.00–0.07)
Basophils Absolute: 0 10*3/uL (ref 0.0–0.1)
Basophils Relative: 0 %
Eosinophils Absolute: 0.1 10*3/uL (ref 0.0–0.5)
Eosinophils Relative: 1 %
HCT: 40 % (ref 39.0–52.0)
Hemoglobin: 13 g/dL (ref 13.0–17.0)
Immature Granulocytes: 1 %
Lymphocytes Relative: 7 %
Lymphs Abs: 0.6 10*3/uL — ABNORMAL LOW (ref 0.7–4.0)
MCH: 29.1 pg (ref 26.0–34.0)
MCHC: 32.5 g/dL (ref 30.0–36.0)
MCV: 89.5 fL (ref 80.0–100.0)
Monocytes Absolute: 0.8 10*3/uL (ref 0.1–1.0)
Monocytes Relative: 10 %
Neutro Abs: 6.3 10*3/uL (ref 1.7–7.7)
Neutrophils Relative %: 81 %
Platelets: 257 10*3/uL (ref 150–400)
RBC: 4.47 MIL/uL (ref 4.22–5.81)
RDW: 14.6 % (ref 11.5–15.5)
WBC: 7.9 10*3/uL (ref 4.0–10.5)
nRBC: 0 % (ref 0.0–0.2)

## 2023-04-25 LAB — BASIC METABOLIC PANEL
Anion gap: 10 (ref 5–15)
BUN: 31 mg/dL — ABNORMAL HIGH (ref 6–20)
CO2: 26 mmol/L (ref 22–32)
Calcium: 8.7 mg/dL — ABNORMAL LOW (ref 8.9–10.3)
Chloride: 98 mmol/L (ref 98–111)
Creatinine, Ser: 1.1 mg/dL (ref 0.61–1.24)
GFR, Estimated: >60 mL/min (ref >60)
Glucose, Bld: 138 mg/dL — ABNORMAL HIGH (ref 70–99)
Potassium: 3.4 mmol/L — ABNORMAL LOW (ref 3.5–5.1)
Sodium: 134 mmol/L — ABNORMAL LOW (ref 135–145)

## 2023-04-25 LAB — SURGICAL PATHOLOGY

## 2023-04-25 MED ORDER — LACTATED RINGERS IV SOLN: INTRAVENOUS | Status: DC

## 2023-04-25 NOTE — Telephone Encounter (Signed)
Patient mother called I advise her I can't give her any information due to her not being on his DPR. I advised her we will need verbal consent from him to speak to her.

## 2023-04-25 NOTE — Plan of Care (Signed)
  Problem: Education: Goal: Knowledge of General Education information will improve Description: Including pain rating scale, medication(s)/side effects and non-pharmacologic comfort measures Outcome: Progressing   Problem: Health Behavior/Discharge Planning: Goal: Ability to manage health-related needs will improve Outcome: Progressing   Problem: Clinical Measurements: Goal: Ability to maintain clinical measurements within normal limits will improve Outcome: Progressing   Problem: Clinical Measurements: Goal: Will remain free from infection Outcome: Progressing   Problem: Coping: Goal: Level of anxiety will decrease Outcome: Progressing   Problem: Elimination: Goal: Will not experience complications related to bowel motility Outcome: Progressing   Problem: Pain Managment: Goal: General experience of comfort will improve Outcome: Progressing

## 2023-04-25 NOTE — Plan of Care (Signed)
  Problem: Education: Goal: Knowledge of General Education information will improve Description: Including pain rating scale, medication(s)/side effects and non-pharmacologic comfort measures Outcome: Progressing   Problem: Health Behavior/Discharge Planning: Goal: Ability to manage health-related needs will improve Outcome: Progressing   Problem: Clinical Measurements: Goal: Will remain free from infection Outcome: Progressing   Problem: Clinical Measurements: Goal: Diagnostic test results will improve Outcome: Progressing   Problem: Activity: Goal: Risk for activity intolerance will decrease Outcome: Progressing   Problem: Nutrition: Goal: Adequate nutrition will be maintained Outcome: Progressing   Problem: Coping: Goal: Level of anxiety will decrease Outcome: Progressing   Problem: Elimination: Goal: Will not experience complications related to urinary retention Outcome: Progressing   Problem: Safety: Goal: Ability to remain free from injury will improve Outcome: Progressing   Problem: Skin Integrity: Goal: Risk for impaired skin integrity will decrease Outcome: Progressing   Problem: Clinical Measurements: Goal: Ability to maintain clinical measurements within normal limits will improve Outcome: Not Progressing   Problem: Elimination: Goal: Will not experience complications related to bowel motility Outcome: Not Progressing   Problem: Pain Managment: Goal: General experience of comfort will improve Outcome: Not Progressing

## 2023-04-25 NOTE — Progress Notes (Addendum)
[facility] SURGICAL PROGRESS NOTE  Hospital Day(s): 2.   Post op day(s): 4 Days Post-Op.   Interval History:  Patient seen and examined No acute events or new complaints overnight.  Patient reports he is still feeling very full; nausea, emesis Labs this morning are still awaiting draw  He had been on CLD; took in 1.8L  Vital signs in last 24 hours: [min-max] current  Temp:  [98 F (36.7 C)-99 F (37.2 C)] 98.9 F (37.2 C) (10/01 0303) Pulse Rate:  [88-96] 88 (10/01 0303) Resp:  [18] 18 (10/01 0303) BP: (128-142)/(90-97) 141/97 (10/01 0303) SpO2:  [94 %-96 %] 94 % (10/01 0303)     Height: 6\' 8"  (203.2 cm) Weight: (!) 170.1 kg BMI (Calculated): 41.2   Intake/Output last 2 shifts:  09/30 0701 - 10/01 0700 In: 1680 [P.O.:1680] Out: 251 [Urine:250; Emesis/NG output:1]   Physical Exam:  Constitutional: alert, cooperative and no distress  Respiratory: breathing non-labored at rest  Cardiovascular: regular rate and sinus rhythm  Gastrointestinal: soft, non-tender, distension appreciable to upper abdomen, no rebound/guarding Integumentary: Laparoscopic incisions are CDI with dermabond, no erythema or drainage  Labs:     Latest Ref Rng & Units 04/24/2023    4:51 AM 04/22/2023    5:23 AM 04/20/2023   11:07 PM  CBC  WBC 4.0 - 10.5 K/uL 10.0  8.5  8.8   Hemoglobin 13.0 - 17.0 g/dL 41.3  24.4  01.0   Hematocrit 39.0 - 52.0 % 38.5  41.1  39.9   Platelets 150 - 400 K/uL 236  160  182       Latest Ref Rng & Units 04/22/2023    5:23 AM 04/20/2023   11:07 PM 03/03/2023    1:43 PM  CMP  Glucose 70 - 99 mg/dL 272  536  90   BUN 6 - 20 mg/dL 16  15  11    Creatinine 0.61 - 1.24 mg/dL 6.44  0.34  7.42   Sodium 135 - 145 mmol/L 140  137  142   Potassium 3.5 - 5.1 mmol/L 3.6  3.2  4.1   Chloride 98 - 111 mmol/L 106  100  106   CO2 22 - 32 mmol/L 24  24  17    Calcium 8.9 - 10.3 mg/dL 8.5  8.8  9.0   Total Protein 6.5 - 8.1 g/dL  7.2  6.7   Total Bilirubin 0.3 - 1.2  mg/dL  1.2  0.6   Alkaline Phos 38 - 126 U/L  52  60   AST 15 - 41 U/L  21  24   ALT 0 - 44 U/L  31  30      Imaging studies: No new pertinent imaging studies   Assessment/Plan: 47 y.o. male with likely post-operative ileus 4 Days Post-Op s/p laparoscopic appendectomy for perforated appendicitis   - Will back down to NPO  - Restart IVF  - Continue IV Abx (Zosyn) for now  - Will get KUB to reassess intra-abdominal process - Follow up labs; Pending results may need repeat CT Abdomen/Pelvis in next 24-48 hours  - Monitor abdominal examination; on-going bowel function - Pain control prn; antiemetics prn   - Mobilize as tolerated   All of the above findings and recommendations were discussed with the patient, and the medical team, and all of patient's questions were answered to his expressed satisfaction.  -- Lynden Oxford, PA-C [facility] 04/25/2023, 7:15 AM M-F: 7am - 4pm

## 2023-04-25 NOTE — Progress Notes (Signed)
Transition of Care [facility]) - Inpatient Brief Assessment   Patient Details  Name: Andrew Andersen. MRN: [id number] Date of Birth: [birth year]  Transition of Care [facility]) CM/SW Contact:    Darolyn Rua, LCSW Phone Number: 04/25/2023, 10:47 AM   Clinical Narrative:  Patient presents to ED for RLQ pain - appendicitis 4 days post op pending f/u labs and potential repeat CT of abdomen.   PCP Dr. Neita Garnet No TOC discharge needs at this time, please consult TOC should needs arise.   Transition of Care Asessment: Insurance and Status: Insurance coverage has been reviewed Patient has primary care physician: Yes Home environment has been reviewed: from home Prior level of function:: independent Prior/Current Home Services: No current home services Social Determinants of Health Reivew: SDOH reviewed no interventions necessary Readmission risk has been reviewed: Yes Transition of care needs: no transition of care needs at this time

## 2023-04-26 ENCOUNTER — Inpatient Hospital Stay: Payer: BC Managed Care – PPO

## 2023-04-26 MED ORDER — SODIUM CHLORIDE 0.9 % IV SOLN
12.5000 mg | Freq: Four times a day (QID) | INTRAVENOUS | Status: DC | PRN
  Administered 2023-04-26: 12.5 mg via INTRAVENOUS
  Filled 2023-04-26: qty 12.5

## 2023-04-26 MED ORDER — DIPHENHYDRAMINE HCL 50 MG/ML IJ SOLN
12.5000 mg | Freq: Every evening | INTRAMUSCULAR | Status: DC | PRN
  Administered 2023-04-27 – 2023-04-28 (×2): 12.5 mg via INTRAVENOUS
  Filled 2023-04-26 (×2): qty 1

## 2023-04-26 MED ORDER — IOHEXOL 300 MG/ML  SOLN
100.0000 mL | Freq: Once | INTRAMUSCULAR | Status: AC | PRN
  Administered 2023-04-26: 100 mL via INTRAVENOUS

## 2023-04-26 NOTE — Consult Note (Signed)
Chief Complaint: Patient was seen in consultation today for abdominal fluid collection  Referring Physician(s): Baker Pierini, MD  Supervising Physician: Pernell Dupre  Patient Status: [facility] - In-pt  History of Present Illness: Andrew Mcadam. is a 47 y.o. male with PMH significant for depression and hypertension being seen today in relation to RLQ abdominal fluid collection. Patient is currently POD#5 following laparoscopic appendectomy for perforated appendicitis. Patient has had bilious emesis and abdominal pain since that time. CT AP today showed RLQ abdominal fluid collection concerning for possible abscess.  Past Medical History:  Diagnosis Date   Allergy    Depression    Hypertension    Seasonal allergies     Past Surgical History:  Procedure Laterality Date   COLONOSCOPY WITH PROPOFOL N/A 08/09/2019   Procedure: COLONOSCOPY WITH PROPOFOL;  Surgeon: Midge Minium, MD;  Location: [facility] ENDOSCOPY;  Service: Endoscopy;  Laterality: N/A;   COLONOSCOPY WITH PROPOFOL N/A 08/02/2022   Procedure: COLONOSCOPY WITH PROPOFOL;  Surgeon: Midge Minium, MD;  Location[facility] ENDOSCOPY;  Service: Endoscopy;  Laterality: N/A;   COLONOSCOPY WITH PROPOFOL N/A 11/10/2022   Procedure: COLONOSCOPY WITH PROPOFOL;  Surgeon: Midge Minium, MD;  Location: [facility] ENDOSCOPY;  Service: Endoscopy;  Laterality: N/A;   KNEE SURGERY Left    LAPAROSCOPIC APPENDECTOMY N/A 04/21/2023   Procedure: APPENDECTOMY LAPAROSCOPIC, PARTIAL CECECTOMY;  Surgeon: Kandis Cocking, MD;  Location: [facility] ORS;  Service: General;  Laterality: N/A;    Allergies: Patient has no known allergies.  Medications: Prior to Admission medications   Medication Sig Start Date End Date Taking? Authorizing Provider  EPINEPHrine 0.3 mg/0.3 mL IJ SOAJ injection Inject 0.3 mg into the muscle as needed for anaphylaxis.   Yes [provider]  losartan-hydrochlorothiazide (HYZAAR) 100-12.5 MG tablet Take 1 tablet by mouth daily.  03/03/23  Yes Jacky Kindle, FNP  sertraline (ZOLOFT) 100 MG tablet Take 150 mg by mouth daily.   Yes [provider]  traZODone (DESYREL) 100 MG tablet Take 100 mg by mouth at bedtime as needed for sleep.   Yes [provider]     Family History  Problem Relation Age of Onset   Hypertension Mother    Colon polyps Mother    Lung cancer Father        stage IV   Colon cancer Paternal Uncle        dx. 1st time in 49s, 2nd time in his 24s   Colon cancer Paternal Uncle    Heart disease Maternal Grandmother    Heart attack Maternal Grandfather    Dementia Paternal Grandmother    Alzheimer's disease Paternal Grandmother     Social History   Socioeconomic History   Marital status: Single    Spouse name: Not on file   Number of children: Not on file   Years of education: Not on file   Highest education level: Not on file  Occupational History   Not on file  Tobacco Use   Smoking status: Former    Current packs/day: 0.00    Average packs/day: 1 pack/day for 10.0 years (10.0 ttl pk-yrs)    Types: Cigarettes    Start date: 07/24/2002    Quit date: 07/24/2012    Years since quitting: 10.7    Passive exposure: Past   Smokeless tobacco: Former    Types: Snuff    Quit date: 01/13/2015  Vaping Use   Vaping status: Never Used  Substance and Sexual Activity   Alcohol use: Yes  Comment: occassionally,none last 24hrs   Drug use: No   Sexual activity: Not on file  Other Topics Concern   Not on file  Social History Narrative   Not on file   Social Determinants of Health   Financial Resource Strain: Not on file  Food Insecurity: No Food Insecurity (04/21/2023)   Hunger Vital Sign    Worried About Running Out of Food in the Last Year: Never true    Ran Out of Food in the Last Year: Never true  Transportation Needs: No Transportation Needs (04/21/2023)   PRAPARE - Administrator, Civil Service (Medical): No    Lack of Transportation (Non-Medical): No   Physical Activity: Not on file  Stress: Not on file  Social Connections: Not on file    Code Status: Full code  Review of Systems: A 12 point ROS discussed and pertinent positives are indicated in the HPI above.  All other systems are negative.  Review of Systems  Constitutional:  Negative for chills and fever.  Respiratory:  Negative for chest tightness and shortness of breath.   Cardiovascular:  Negative for chest pain and leg swelling.  Gastrointestinal:  Positive for abdominal pain, nausea and vomiting. Negative for diarrhea.  Neurological:  Positive for dizziness. Negative for headaches.  Psychiatric/Behavioral:  Negative for confusion.     Vital Signs: BP (!) 158/85 (BP Location: Left Arm)   Pulse 77   Temp 97.8 F (36.6 C) (Oral)   Resp 20   Ht 6\' 8"  (2.032 m)   Wt (!) 375 lb (170.1 kg)   SpO2 95%   BMI 41.20 kg/m     Physical Exam Vitals reviewed.  Constitutional:      General: He is not in acute distress.    Appearance: He is ill-appearing.  HENT:     Nose:     Comments: NG tube in place with green bilious output in suction at time of exam Cardiovascular:     Rate and Rhythm: Normal rate and regular rhythm.     Pulses: Normal pulses.     Heart sounds: Normal heart sounds.  Pulmonary:     Effort: Pulmonary effort is normal.     Breath sounds: Normal breath sounds.  Abdominal:     Palpations: Abdomen is soft.     Tenderness: There is abdominal tenderness. There is no rebound.     Comments: Generalized tenderness to palpation, no rebound  Musculoskeletal:     Right lower leg: No edema.     Left lower leg: No edema.  Skin:    General: Skin is warm and dry.  Neurological:     Mental Status: He is alert and oriented to person, place, and time.  Psychiatric:        Mood and Affect: Mood normal.        Behavior: Behavior normal.        Thought Content: Thought content normal.        Judgment: Judgment normal.     Imaging: CT ABDOMEN PELVIS W  CONTRAST  Result Date: 04/26/2023 CLINICAL DATA:  Abdominal pain following appendectomy for perforated appendicitis. Emesis. Laparoscopic appendectomy 5 days ago. EXAM: CT ABDOMEN AND PELVIS WITH CONTRAST TECHNIQUE: Multidetector CT imaging of the abdomen and pelvis was performed using the standard protocol following bolus administration of intravenous contrast. RADIATION DOSE REDUCTION: This exam was performed according to the departmental dose-optimization program which includes automated exposure control, adjustment of the mA and/or kV according to patient size  and/or use of iterative reconstruction technique. CONTRAST:  OMNIPAQUE IOHEXOL 300 MG/ML  SOLN COMPARISON:  CT abdomen and pelvis 04/21/2023. FINDINGS: Lower chest: Progressive dependent atelectasis is present in the right lower lobe. The heart size is normal. No significant pleural or pericardial effusion is present. Hepatobiliary: Diffuse fatty infiltration of the liver is present. The common bile duct and gallbladder are normal. Pancreas: Unremarkable. No pancreatic ductal dilatation or surrounding inflammatory changes. Spleen: Normal in size without focal abnormality. Adrenals/Urinary Tract: The adrenal glands are normal bilaterally. Kidneys are unremarkable. No stone or mass lesion is present either kidney. Perinephric stranding is noted. The ureters are within normal limits bilaterally. The urinary bladder is within normal limits. Stomach/Bowel: The stomach is mildly distended with a fluid level. The duodenum is and proximal small bowel is distended. The transition point is in the anterior inferior abdomen and pelvis. No obstructing lesion is present. The distal small bowel is collapsed to the level of inflammatory changes at the operative site. A fluid collection in the right lower quadrant measures 3.4 x 6.4 x 4.5 cm with a small locule of gas. The colonic anastomosis is intact. The ascending and transverse colon are within normal limits  above the anastomosis. The descending and sigmoid colon are mostly collapsed. Diverticular changes are present in the sigmoid colon without focal inflammation to suggest diverticulitis. Vascular/Lymphatic: No significant vascular findings are present. No enlarged abdominal or pelvic lymph nodes. Reproductive: Prostate is unremarkable. Other: Free fluid is again noted into the dependant in the anatomic pelvis. No free air is present. A small paraumbilical hernia contains fat without bowel. No other significant ventral hernias are present. Musculoskeletal: Transitional S1 anatomy is present. The last fully formed vertebral body is L5. Endplate sclerotic changes are present at L5-S1. The most significant disc disease at L4-5 with moderate central and bilateral foraminal stenosis. The bony pelvis is unremarkable. The hips are located. Mild degenerative changes are worse right than left. IMPRESSION: 1. Small bowel obstruction with a transition point in the anterior inferior abdomen and pelvis. No obstructing lesion is present. 2. 3.4 x 6.4 x 4.5 cm fluid collection in the right lower quadrant with a small locule of gas. This is concerning for a postoperative abscess following laparoscopic appendectomy. 3. Hepatic steatosis. 4. Sigmoid diverticulosis without diverticulitis. 5. Small paraumbilical hernia contains fat without bowel. 6. Degenerative changes of the lumbar spine and hips are worse on the right. These results will be called to the ordering clinician or representative by the Radiologist Assistant, and communication documented in the PACS or [employer]. Electronically Signed   By: Marin Roberts M.D.   On: 04/26/2023 14:23   DG Abd Portable 1V  Result Date: 04/26/2023 CLINICAL DATA:  NG tube present. EXAM: PORTABLE ABDOMEN - 1 VIEW COMPARISON:  Two-view abdomen 04/25/2023 FINDINGS: Progressive gaseous distension of small bowel is noted. The side port of the NG tube is in the fundus the stomach. No  free air is present. IMPRESSION: 1. Progressive gaseous distension of small bowel compatible with obstruction. 2. NG tube in the fundus of the stomach. Electronically Signed   By: Marin Roberts M.D.   On: 04/26/2023 14:03   DG Abd 2 Views  Result Date: 04/25/2023 CLINICAL DATA:  Postoperative ileus.  Appendectomy 4 days ago. EXAM: ABDOMEN - 2 VIEW COMPARISON:  None Available. FINDINGS: Severely dilated small bowel loops are noted with air-fluid levels. No definite colonic dilatation is noted. It is uncertain if this is due to ileus or distal  small bowel obstruction. IMPRESSION: Severe small bowel dilatation is noted with air-fluid levels concerning for postoperative ileus or possibly distal small bowel obstruction. Follow-up radiographs are recommended. Electronically Signed   By: Lupita Raider M.D.   On: 04/25/2023 12:24   CT ABDOMEN PELVIS W CONTRAST  Result Date: 04/21/2023 CLINICAL DATA:  Right lower quadrant pain that started a few days ago. EXAM: CT ABDOMEN AND PELVIS WITH CONTRAST TECHNIQUE: Multidetector CT imaging of the abdomen and pelvis was performed using the standard protocol following bolus administration of intravenous contrast. RADIATION DOSE REDUCTION: This exam was performed according to the departmental dose-optimization program which includes automated exposure control, adjustment of the mA and/or kV according to patient size and/or use of iterative reconstruction technique. CONTRAST:  OMNIPAQUE IOHEXOL 350 MG/ML SOLN COMPARISON:  None Available. FINDINGS: Lower chest: No acute abnormality. Hepatobiliary: Hepatic steatosis. Normal gallbladder. No biliary dilation. Pancreas: Unremarkable. Spleen: Unremarkable. Adrenals/Urinary Tract: Normal adrenal glands. No urinary calculi or hydronephrosis. Bladder is unremarkable. Stomach/Bowel: Dilated appendix measuring 16 mm in diameter with adjacent periappendiceal inflammatory fat stranding and small volume free fluid. No evidence  of perforation. No abscess. Mild reactive wall thickening of the adjacent terminal ileum. Normal caliber large and small bowel. Colonic diverticulosis without diverticulitis. Stomach is within normal limits. Vascular/Lymphatic: No significant vascular findings are present. No enlarged abdominal or pelvic lymph nodes. Reproductive: Unremarkable. Other: Small fat containing umbilical hernia. Small free fluid in the pelvis. Musculoskeletal: No acute fracture. IMPRESSION: 1. Acute uncomplicated appendicitis. 2. Hepatic steatosis. Electronically Signed   By: Minerva Fester M.D.   On: 04/21/2023 03:23    Labs:  CBC: Recent Labs    04/20/23 2307 04/22/23 0523 04/24/23 0451 04/25/23 0750  WBC 8.8 8.5 10.0 7.9  HGB 13.4 13.3 13.0 13.0  HCT 39.9 41.1 38.5* 40.0  PLT 182 160 236 257    COAGS: No results for input(s): "INR", "APTT" in the last 8760 hours.  BMP: Recent Labs    03/03/23 1343 04/20/23 2307 04/22/23 0523 04/25/23 0750  NA 142 137 140 134*  K 4.1 3.2* 3.6 3.4*  CL 106 100 106 98  CO2 17* 24 24 26   GLUCOSE 90 113* 150* 138*  BUN 11 15 16  31*  CALCIUM 9.0 8.8* 8.5* 8.7*  CREATININE 1.02 1.16 1.17 1.10  GFRNONAA  --  >60 >60 >60    LIVER FUNCTION TESTS: Recent Labs    06/29/22 1046 03/03/23 1343 04/20/23 2307  BILITOT 0.5 0.6 1.2  AST 31 24 21   ALT 52* 30 31  ALKPHOS 67 60 52  PROT 6.6 6.7 7.2  ALBUMIN 4.7 4.5 4.2    TUMOR MARKERS: No results for input(s): "AFPTM", "CEA", "CA199", "CHROMGRNA" in the last 8760 hours.  Assessment and Plan:  Andrew Andersen is a 47 yo male being seen today in relation to newly discovered post-operative RLQ abdominal fluid collection concerning for abscess. Patient is POD#5 from laparoscopic appendectomy. Case reviewed and approved by Dr Juliette Alcide for image-guided abdominal fluid aspiration with possible drain placement. Patient is NPO. Pre-procedural labs to be drawn in AM, Lovenox has been held.  Risks and benefits discussed with  the patient including bleeding, infection, damage to adjacent structures, bowel perforation/fistula connection, and sepsis.  All of the patient's questions were answered, patient is agreeable to proceed. Consent signed and in chart.   Thank you for this interesting consult.  I greatly enjoyed meeting Andrew Andersen. and look forward to participating in their care.  A copy  of this report was sent to the requesting provider on this date.  Electronically Signed: Kennieth Francois, PA-C 04/26/2023, 3:28 PM   I spent a total of 20 Minutes  in face to face in clinical consultation, greater than 50% of which was counseling/coordinating care for abdominal fluid collection.

## 2023-04-26 NOTE — Progress Notes (Signed)
[facility] SURGICAL PROGRESS NOTE  Hospital Day(s): 3.   Post op day(s): 5 Days Post-Op.   Interval History:  Patient seen and examined No acute events or new complaints overnight.  Patient continues to have nausea, another episode of emesis this morning He did have a BM overnight No fever, chills No new labs this morning  He is NPO  Vital signs in last 24 hours: [min-max] current  Temp:  [98.3 F (36.8 C)-98.7 F (37.1 C)] 98.3 F (36.8 C) (10/02 0741) Pulse Rate:  [75-92] 84 (10/02 0741) Resp:  [16-18] 16 (10/02 0741) BP: (135-165)/(91-101) 154/94 (10/02 0741) SpO2:  [92 %-95 %] 92 % (10/02 0741)     Height: 6\' 8"  (203.2 cm) Weight: (!) 170.1 kg BMI (Calculated): 41.2   Intake/Output last 2 shifts:  10/01 0701 - 10/02 0700 In: 1776.7 [I.V.:1776.7] Out: -    Physical Exam:  Constitutional: alert, cooperative and no distress  Respiratory: breathing non-labored at rest  Cardiovascular: regular rate and sinus rhythm  Gastrointestinal: soft, non-tender, distension appreciable to upper abdomen, no rebound/guarding Integumentary: Laparoscopic incisions are CDI with dermabond, no erythema or drainage  Labs:     Latest Ref Rng & Units 04/25/2023    7:50 AM 04/24/2023    4:51 AM 04/22/2023    5:23 AM  CBC  WBC 4.0 - 10.5 K/uL 7.9  10.0  8.5   Hemoglobin 13.0 - 17.0 g/dL 09.8  11.9  14.7   Hematocrit 39.0 - 52.0 % 40.0  38.5  41.1   Platelets 150 - 400 K/uL 257  236  160       Latest Ref Rng & Units 04/25/2023    7:50 AM 04/22/2023    5:23 AM 04/20/2023   11:07 PM  CMP  Glucose 70 - 99 mg/dL 829  562  130   BUN 6 - 20 mg/dL 31  16  15    Creatinine 0.61 - 1.24 mg/dL 8.65  7.84  6.96   Sodium 135 - 145 mmol/L 134  140  137   Potassium 3.5 - 5.1 mmol/L 3.4  3.6  3.2   Chloride 98 - 111 mmol/L 98  106  100   CO2 22 - 32 mmol/L 26  24  24    Calcium 8.9 - 10.3 mg/dL 8.7  8.5  8.8   Total Protein 6.5 - 8.1 g/dL   7.2   Total Bilirubin 0.3 - 1.2 mg/dL   1.2    Alkaline Phos 38 - 126 U/L   52   AST 15 - 41 U/L   21   ALT 0 - 44 U/L   31      Imaging studies: No new pertinent imaging studies   Assessment/Plan: 47 y.o. male with likely post-operative ileus 5 Days Post-Op s/p laparoscopic appendectomy for perforated appendicitis   - Will continue NPO given continued emesis; okay for a few ice chips  - Given slow improvements, will plan for repeat CT Abdomen/Pelvis this AM with IV contrast to ensure no missed intra-abdominal process  - Continue IVF support   - Continue IV Abx (Zosyn) for now - Repeat labs in the AM  - Monitor abdominal examination; on-going bowel function - Pain control prn; antiemetics prn   - Continued ambulation encouraged   All of the above findings and recommendations were discussed with the patient, and the medical team, and all of patient's questions were answered to his expressed satisfaction.  -- Lynden Oxford, PA-C  [facility] 04/26/2023, 7:47  AM M-F: 7am - 4pm

## 2023-04-27 ENCOUNTER — Inpatient Hospital Stay: Payer: BC Managed Care – PPO

## 2023-04-27 LAB — BASIC METABOLIC PANEL
Anion gap: 11 (ref 5–15)
BUN: 24 mg/dL — ABNORMAL HIGH (ref 6–20)
CO2: 28 mmol/L (ref 22–32)
Calcium: 8.7 mg/dL — ABNORMAL LOW (ref 8.9–10.3)
Chloride: 101 mmol/L (ref 98–111)
Creatinine, Ser: 1.06 mg/dL (ref 0.61–1.24)
GFR, Estimated: >60 mL/min (ref >60)
Glucose, Bld: 106 mg/dL — ABNORMAL HIGH (ref 70–99)
Potassium: 3.1 mmol/L — ABNORMAL LOW (ref 3.5–5.1)
Sodium: 140 mmol/L (ref 135–145)

## 2023-04-27 LAB — CBC
HCT: 35.8 % — ABNORMAL LOW (ref 39.0–52.0)
Hemoglobin: 11.9 g/dL — ABNORMAL LOW (ref 13.0–17.0)
MCH: 29.3 pg (ref 26.0–34.0)
MCHC: 33.2 g/dL (ref 30.0–36.0)
MCV: 88.2 fL (ref 80.0–100.0)
Platelets: 248 10*3/uL (ref 150–400)
RBC: 4.06 MIL/uL — ABNORMAL LOW (ref 4.22–5.81)
RDW: 14.8 % (ref 11.5–15.5)
WBC: 7.7 10*3/uL (ref 4.0–10.5)
nRBC: 0 % (ref 0.0–0.2)

## 2023-04-27 LAB — APTT: aPTT: 34 s (ref 24–36)

## 2023-04-27 LAB — PROTIME-INR
INR: 1.2 (ref 0.8–1.2)
Prothrombin Time: 15.1 s (ref 11.4–15.2)

## 2023-04-27 MED ORDER — POTASSIUM CHLORIDE 2 MEQ/ML IV SOLN
INTRAVENOUS | Status: DC
  Filled 2023-04-27 (×15): qty 1000

## 2023-04-27 MED ORDER — MIDAZOLAM HCL 2 MG/2ML IJ SOLN
INTRAMUSCULAR | Status: DC | PRN
Start: 2023-04-27 — End: 2023-04-30
  Administered 2023-04-27: 1 mg via INTRAVENOUS

## 2023-04-27 MED ORDER — FENTANYL CITRATE (PF) 100 MCG/2ML IJ SOLN
INTRAMUSCULAR | Status: AC
  Filled 2023-04-27: qty 2

## 2023-04-27 MED ORDER — LIDOCAINE 1 % OPTIME INJ - NO CHARGE
10.0000 mL | Freq: Once | INTRAMUSCULAR | Status: AC
  Administered 2023-04-27: 10 mL via SUBCUTANEOUS

## 2023-04-27 MED ORDER — FENTANYL CITRATE (PF) 100 MCG/2ML IJ SOLN
INTRAMUSCULAR | Status: DC | PRN
Start: 2023-04-27 — End: 2023-04-30
  Administered 2023-04-27: 50 ug via INTRAVENOUS

## 2023-04-27 MED ORDER — MIDAZOLAM HCL 2 MG/2ML IJ SOLN
INTRAMUSCULAR | Status: AC
  Filled 2023-04-27: qty 2

## 2023-04-27 NOTE — Procedures (Signed)
Interventional Radiology Procedure Note  Procedure: CT guided aspiration of RLQ fluid collection  Complications: None  Estimated Blood Loss: None  Findings: 18 G trocar needle advanced into loculated fluid in RLQ, yielding clear, slightly amber tinged fluid, possibly reflecting small amount of old blood. Able to aspirate 12 mL, resulting in near complete decompression. Sample sent for culture.   Jodi Marble. Fredia Sorrow, M.D Pager:  [phone]

## 2023-04-27 NOTE — Progress Notes (Signed)
[facility] SUR[facility]ICAL PROGRESS NOTE  Hospital Day(s): 4.   Post op day(s): 6 Days Post-Op.   Interval History:  Patient seen and examined No acute events or new complaints overnight.  Patient reports he is actulaly feeling improved this morning Of course, frustrated with NGT Nausea/emesis and hiccups have subsided No fever, chills Remains without leukocytosis; 7.7K Hgb to 11.9 Renal function normal; sCr - 1.06; UO - 650 ccs NGT placed yesterday; output recorded at 4L He is NPO  Vital signs in last 24 hours: [min-max] current  Temp:  [97.8 F (36.6 C)-98.2 F (36.8 C)] 98.2 F (36.8 C) (10/03 0610) Pulse Rate:  [72-77] 72 (10/03 0610) Resp:  [15-20] 15 (10/03 0610) BP: (151-159)/(85-97) 152/94 (10/03 0610) SpO2:  [93 %-95 %] 94 % (10/03 0610)     Height: 6\' 8"  (203.2 cm) Weight: (!) 170.1 kg BMI (Calculated): 41.2   Intake/Output last 2 shifts:  10/02 0701 - 10/03 0700 In: 3157.9 [I.V.:2392.9; IV Piggyback:765] Out: 4650 [Urine:650; Emesis/NG output:4000]   Physical Exam:  Constitutional: alert, cooperative and no distress  HEENT: NGT in place; output thinning  Respiratory: breathing non-labored at rest  Cardiovascular: regular rate and sinus rhythm  Gastrointestinal: soft, non-tender, distension appreciable to upper abdomen, no rebound/guarding Integumentary: Laparoscopic incisions are CDI with dermabond, no erythema or drainage  Labs:     Latest Ref Rng & Units 04/27/2023    4:13 AM 04/25/2023    7:50 AM 04/24/2023    4:51 AM  CBC  WBC 4.0 - 10.5 K/uL 7.7  7.9  10.0   Hemoglobin 13.0 - 17.0 g/dL 09.8  11.9  14.7   Hematocrit 39.0 - 52.0 % 35.8  40.0  38.5   Platelets 150 - 400 K/uL 248  257  236       Latest Ref Rng & Units 04/27/2023    4:13 AM 04/25/2023    7:50 AM 04/22/2023    5:23 AM  CMP  Glucose 70 - 99 mg/dL 829  562  130   BUN 6 - 20 mg/dL 24  31  16    Creatinine 0.61 - 1.24 mg/dL 8.65  7.84  6.96   Sodium 135 - 145 mmol/L 140  134   140   Potassium 3.5 - 5.1 mmol/L 3.1  3.4  3.6   Chloride 98 - 111 mmol/L 101  98  106   CO2 22 - 32 mmol/L 28  26  24    Calcium 8.9 - 10.3 mg/dL 8.7  8.7  8.5      Imaging studies: No new pertinent imaging studies   Assessment/Plan: 47 y.o. male with likely post-operative ileus 6 Days Post-Op s/p laparoscopic appendectomy for perforated appendicitis   - Plan for IR aspiration vs drain placement today of RLQ intra-abdominal fluid placement  - Continue NGT decompression; LIS; monitor and record output  - Continue IVF support   - Continue IV Abx (Zosyn) for now  - Replete K+  - Monitor abdominal examination; on-going bowel function - Pain control prn; antiemetics prn   - Continued ambulation encouraged   All of the above findings and recommendations were discussed with the patient, and the medical team, and all of patient's questions were answered to his expressed satisfaction.  -- Lynden Oxford, PA-C [facility] 04/27/2023, 8:11 AM M-F: 7am - 4pm

## 2023-04-27 NOTE — Plan of Care (Signed)

## 2023-04-27 NOTE — Progress Notes (Signed)
Patient clinically stable post aspiration/CT per Dr Fredia Sorrow, of 12 ml amber/pink tinged fluid , tolerated well. Vitals stable pre and post procedure. Denies complaints post procedure. Received Versed 1 mg along with Fentanyl 50 mcg IV for procedure. Update given to mother and sister post procedure. Report given to Noell RN post procedure/recovery at bedside.

## 2023-04-27 NOTE — Plan of Care (Signed)
  Problem: Clinical Measurements: Goal: Ability to maintain clinical measurements within normal limits will improve Outcome: Progressing   Problem: Clinical Measurements: Goal: Will remain free from infection Outcome: Progressing   Problem: Clinical Measurements: Goal: Diagnostic test results will improve Outcome: Progressing   Problem: Clinical Measurements: Goal: Respiratory complications will improve Outcome: Progressing   Problem: Clinical Measurements: Goal: Cardiovascular complication will be avoided Outcome: Progressing   Problem: Nutrition: Goal: Adequate nutrition will be maintained Outcome: Progressing

## 2023-04-28 ENCOUNTER — Inpatient Hospital Stay: Payer: BC Managed Care – PPO

## 2023-04-28 MED ORDER — ENOXAPARIN SODIUM 100 MG/ML IJ SOSY
0.5000 mg/kg | PREFILLED_SYRINGE | INTRAMUSCULAR | Status: DC
  Administered 2023-04-28 – 2023-05-02 (×5): 85 mg via SUBCUTANEOUS
  Filled 2023-04-28 (×6): qty 1

## 2023-04-28 NOTE — Plan of Care (Signed)
  Problem: Clinical Measurements: Goal: Ability to maintain clinical measurements within normal limits will improve Outcome: Progressing   Problem: Clinical Measurements: Goal: Will remain free from infection Outcome: Progressing   Problem: Clinical Measurements: Goal: Diagnostic test results will improve Outcome: Progressing   

## 2023-04-28 NOTE — Progress Notes (Signed)
[facility] SUR[facility]ICAL PROGRESS NOTE  Hospital Day(s): 5.   Post op day(s): 7 Days Post-Op.   Interval History:  Patient seen and examined No acute events or new complaints overnight.  Patient reports he actually continues to report feeling improved Nausea/emesis remains resolved No fever, chills No new labs this AM Cx from aspiration pending; no growth NGT with 2225 ccs out He is NPO He iis passing flatus and had BM  Vital signs in last 24 hours: [min-max] current  Temp:  [97.7 F (36.5 C)-98.4 F (36.9 C)] 98.4 F (36.9 C) (10/04 0344) Pulse Rate:  [66-77] 71 (10/04 0344) Resp:  [13-20] 13 (10/04 0344) BP: (133-158)/(84-96) 158/91 (10/04 0344) SpO2:  [92 %-96 %] 93 % (10/04 0344) Weight:  [170 kg] 170 kg (10/03 1425)     Height: 6\' 8"  (203.2 cm) Weight: (!) 170 kg BMI (Calculated): 41.17   Intake/Output last 2 shifts:  10/03 0701 - 10/04 0700 In: 2520.8 [I.V.:1810.7; IV Piggyback:710.1] Out: 2225 [Emesis/NG output:2225]   Physical Exam:  Constitutional: alert, cooperative and no distress  HEENT: NGT in place; output thinning  Respiratory: breathing non-labored at rest  Cardiovascular: regular rate and sinus rhythm  Gastrointestinal: soft, non-tender, distension appreciable to upper abdomen, no rebound/guarding Integumentary: Laparoscopic incisions are CDI with dermabond, no erythema or drainage  Labs:     Latest Ref Rng & Units 04/27/2023    4:13 AM 04/25/2023    7:50 AM 04/24/2023    4:51 AM  CBC  WBC 4.0 - 10.5 K/uL 7.7  7.9  10.0   Hemoglobin 13.0 - 17.0 g/dL 69.6  29.5  28.4   Hematocrit 39.0 - 52.0 % 35.8  40.0  38.5   Platelets 150 - 400 K/uL 248  257  236       Latest Ref Rng & Units 04/27/2023    4:13 AM 04/25/2023    7:50 AM 04/22/2023    5:23 AM  CMP  Glucose 70 - 99 mg/dL 132  440  102   BUN 6 - 20 mg/dL 24  31  16    Creatinine 0.61 - 1.24 mg/dL 7.25  3.66  4.40   Sodium 135 - 145 mmol/L 140  134  140   Potassium 3.5 - 5.1 mmol/L  3.1  3.4  3.6   Chloride 98 - 111 mmol/L 101  98  106   CO2 22 - 32 mmol/L 28  26  24    Calcium 8.9 - 10.3 mg/dL 8.7  8.7  8.5      Imaging studies: No new pertinent imaging studies   Assessment/Plan: 47 y.o. male with likely post-operative ileus 7 Days Post-Op s/p laparoscopic appendectomy for perforated appendicitis   - Will get KUB to ensure proper NGT location and to reassess bowel given high output NGT  - Continue NGT decompression for now; LIS; monitor and record output  - Continue IVF support   - Discontinue IV Abx today  - Monitor abdominal examination; on-going bowel function - Pain control prn; antiemetics prn  - Morning labs   - Continued ambulation encouraged   All of the above findings and recommendations were discussed with the patient, and the medical team, and all of patient's questions were answered to his expressed satisfaction.  -- Lynden Oxford, PA-C [facility] 04/28/2023, 7:53 AM M-F: 7am - 4pm

## 2023-04-28 NOTE — Plan of Care (Signed)

## 2023-04-29 LAB — BASIC METABOLIC PANEL
Anion gap: 11 (ref 5–15)
BUN: 18 mg/dL (ref 6–20)
CO2: 30 mmol/L (ref 22–32)
Calcium: 8.5 mg/dL — ABNORMAL LOW (ref 8.9–10.3)
Chloride: 103 mmol/L (ref 98–111)
Creatinine, Ser: 1.16 mg/dL (ref 0.61–1.24)
GFR, Estimated: >60 mL/min (ref >60)
Glucose, Bld: 108 mg/dL — ABNORMAL HIGH (ref 70–99)
Potassium: 3.5 mmol/L (ref 3.5–5.1)
Sodium: 144 mmol/L (ref 135–145)

## 2023-04-29 LAB — CBC
HCT: 36.9 % — ABNORMAL LOW (ref 39.0–52.0)
Hemoglobin: 11.7 g/dL — ABNORMAL LOW (ref 13.0–17.0)
MCH: 29.1 pg (ref 26.0–34.0)
MCHC: 31.7 g/dL (ref 30.0–36.0)
MCV: 91.8 fL (ref 80.0–100.0)
Platelets: 249 10*3/uL (ref 150–400)
RBC: 4.02 MIL/uL — ABNORMAL LOW (ref 4.22–5.81)
RDW: 14.5 % (ref 11.5–15.5)
WBC: 6.7 10*3/uL (ref 4.0–10.5)
nRBC: 0 % (ref 0.0–0.2)

## 2023-04-29 NOTE — Progress Notes (Signed)
04/29/2023  Subjective: Patient is 8 Days Post-Op status post laparoscopic appendectomy.  No acute events overnight.  Patient's KUB from yesterday still shows small bowel dilation with air-fluid levels.  The patient reports that he still having flatus and had bowel movement earlier today.  However his NG output is quite high at 1600 mL over the last 24 hours.  Denies any worsening pain.  Vital signs: Temp:  [97.6 F (36.4 C)-98.6 F (37 C)] 98.6 F (37 C) (10/05 0914) Pulse Rate:  [70-78] 78 (10/05 0914) Resp:  [16-19] 19 (10/05 0914) BP: (146-159)/(92-95) 159/94 (10/05 0914) SpO2:  [93 %-95 %] 93 % (10/05 0914)   Intake/Output: 10/04 0701 - 10/05 0700 In: -  Out: 1800 [Urine:200; Emesis/NG output:1600] Last BM Date : 04/28/23  Physical Exam: Constitutional: No acute distress Abdomen: Soft, nondistended, appropriately tender to palpation.  Incisions are clean, dry, intact.  NG tube in place with bilious fluid in the canister.  Labs:  Recent Labs    04/27/23 0413 04/29/23 0522  WBC 7.7 6.7  HGB 11.9* 11.7*  HCT 35.8* 36.9*  PLT 248 249   Recent Labs    04/27/23 0413 04/29/23 0522  NA 140 144  K 3.1* 3.5  CL 101 103  CO2 28 30  GLUCOSE 106* 108*  BUN 24* 18  CREATININE 1.06 1.16  CALCIUM 8.7* 8.5*   Recent Labs    04/27/23 0413  LABPROT 15.1  INR 1.2    Imaging: No results found.  Assessment/Plan: This is a 47 y.o. male s/p laparoscopic appendectomy for perforated appendicitis.  - Reviewed with the patient his images from KUB yesterday showing dilated loops of small bowel.  Clinically, the patient feels well and is having flatus and bowel function but his NG output is quite high.  Discussed with patient that today we can do a trial of clamping his NG tube.  We will clamp this for about 4 hours and recheck residuals after that.  If truly low output, may be able to DC his NG tube.  Advised the patient to refrain from drinking any sips of water or taking ice  chips to decrease any confounding factors with this clamping trial. - Otherwise continue ambulation, fluid hydration, pain control.  Antibiotics were stopped yesterday as he has completed a week course.   Howie Ill, MD [facility]

## 2023-04-29 NOTE — Plan of Care (Signed)

## 2023-04-30 LAB — BASIC METABOLIC PANEL
Anion gap: 13 (ref 5–15)
BUN: 16 mg/dL (ref 6–20)
CO2: 28 mmol/L (ref 22–32)
Calcium: 8.6 mg/dL — ABNORMAL LOW (ref 8.9–10.3)
Chloride: 103 mmol/L (ref 98–111)
Creatinine, Ser: 1.02 mg/dL (ref 0.61–1.24)
GFR, Estimated: >60 mL/min (ref >60)
Glucose, Bld: 96 mg/dL (ref 70–99)
Potassium: 3.8 mmol/L (ref 3.5–5.1)
Sodium: 144 mmol/L (ref 135–145)

## 2023-04-30 LAB — MAGNESIUM: Magnesium: 2.3 mg/dL (ref 1.7–2.4)

## 2023-04-30 MED ORDER — METOCLOPRAMIDE HCL 5 MG/ML IJ SOLN
10.0000 mg | Freq: Three times a day (TID) | INTRAMUSCULAR | Status: DC
  Administered 2023-04-30 – 2023-05-02 (×5): 10 mg via INTRAVENOUS
  Filled 2023-04-30 (×8): qty 2

## 2023-04-30 NOTE — Progress Notes (Signed)
04/30/2023  Subjective: Patient is 9 Days Post-Op status post laparoscopic appendectomy.  No acute events overnight.  NG clamping trial was attempted yesterday but residual was still high.  Otherwise the patient denies any nausea or worsening pain.  Has been having flatus and bowel movements.  Vital signs: Temp:  [97.7 F (36.5 C)-98 F (36.7 C)] 98 F (36.7 C) (10/06 0931) Pulse Rate:  [67-75] 69 (10/06 0931) Resp:  [18-20] 18 (10/06 0439) BP: (138-158)/(79-95) 139/79 (10/06 0931) SpO2:  [92 %-96 %] 96 % (10/06 0931)   Intake/Output: 10/05 0701 - 10/06 0700 In: -  Out: 250 [Emesis/NG output:250] Last BM Date : 04/28/23  Physical Exam: Constitutional: No acute distress Abdomen: Soft, nondistended, nontender to palpation.  Incisions are clean, dry, intact.  Labs:  Recent Labs    04/29/23 0522  WBC 6.7  HGB 11.7*  HCT 36.9*  PLT 249   Recent Labs    04/29/23 0522 04/30/23 0446  NA 144 144  K 3.5 3.8  CL 103 103  CO2 30 28  GLUCOSE 108* 96  BUN 18 16  CREATININE 1.16 1.02  CALCIUM 8.5* 8.6*   No results for input(s): "LABPROT", "INR" in the last 72 hours.  Imaging: No results found.  Assessment/Plan: This is a 48 y.o. male s/p laparoscopic appendectomy with postoperative ileus.  - NG tube clamping trial was reattempt today but upon checking residuals, the residual was still high.  However the patient denies any nausea and continued having flatus.  I think for now we can keep the NG tube clamped knowing that we will reconnect to suction of the patient became more distended or nauseous. - For now also I will start him on IV Reglan to help with promotility. - Continue n.p.o. and IV fluid hydration.   Howie Ill, MD [facility]

## 2023-04-30 NOTE — Plan of Care (Signed)
  Problem: Clinical Measurements: Goal: Ability to maintain clinical measurements within normal limits will improve Outcome: Progressing   Problem: Activity: Goal: Risk for activity intolerance will decrease Outcome: Progressing   Problem: Nutrition: Goal: Adequate nutrition will be maintained Outcome: Progressing   Problem: Elimination: Goal: Will not experience complications related to bowel motility Outcome: Progressing   Problem: Safety: Goal: Ability to remain free from injury will improve Outcome: Progressing   Problem: Skin Integrity: Goal: Risk for impaired skin integrity will decrease Outcome: Progressing   

## 2023-05-01 LAB — BASIC METABOLIC PANEL
Anion gap: 12 (ref 5–15)
BUN: 16 mg/dL (ref 6–20)
CO2: 27 mmol/L (ref 22–32)
Calcium: 8.7 mg/dL — ABNORMAL LOW (ref 8.9–10.3)
Chloride: 106 mmol/L (ref 98–111)
Creatinine, Ser: 0.99 mg/dL (ref 0.61–1.24)
GFR, Estimated: >60 mL/min (ref >60)
Glucose, Bld: 101 mg/dL — ABNORMAL HIGH (ref 70–99)
Potassium: 4.2 mmol/L (ref 3.5–5.1)
Sodium: 145 mmol/L (ref 135–145)

## 2023-05-01 MED ORDER — HYDROCHLOROTHIAZIDE 12.5 MG PO TABS
12.5000 mg | ORAL_TABLET | Freq: Every day | ORAL | Status: DC
  Administered 2023-05-01 – 2023-05-03 (×3): 12.5 mg via ORAL
  Filled 2023-05-01 (×3): qty 1

## 2023-05-01 MED ORDER — LOSARTAN POTASSIUM-HCTZ 100-12.5 MG PO TABS: 1.0000 | ORAL_TABLET | Freq: Every day | ORAL | Status: DC

## 2023-05-01 MED ORDER — TRAZODONE HCL 100 MG PO TABS: 100.0000 mg | ORAL_TABLET | Freq: Every evening | ORAL | Status: DC | PRN

## 2023-05-01 MED ORDER — LOSARTAN POTASSIUM 50 MG PO TABS
100.0000 mg | ORAL_TABLET | Freq: Every day | ORAL | Status: DC
  Administered 2023-05-01 – 2023-05-03 (×3): 100 mg via ORAL
  Filled 2023-05-01 (×3): qty 2

## 2023-05-01 MED ORDER — SERTRALINE HCL 50 MG PO TABS
150.0000 mg | ORAL_TABLET | Freq: Every day | ORAL | Status: DC
  Administered 2023-05-02 – 2023-05-03 (×2): 150 mg via ORAL
  Filled 2023-05-01 (×3): qty 3

## 2023-05-01 NOTE — Progress Notes (Signed)
[facility] PROGRESS NOTE  Hospital Day(s): 8.   Post op day(s): 10 Days Post-Op.   Interval History:  Patient seen and examined NGT kept to gravity over last 24 hours, reports no nausea or vomiting Had a bowel movement this AM.   Vital signs in last 24 hours: [min-max] current  Temp:  [98.1 F (36.7 C)-98.4 F (36.9 C)] 98.1 F (36.7 C) (10/07 0814) Pulse Rate:  [65-74] 74 (10/07 0814) Resp:  [16-18] 18 (10/07 0814) BP: (151-171)/(91-103) 153/91 (10/07 0915) SpO2:  [94 %-96 %] 96 % (10/07 0814)     Height: 6\' 8"  (203.2 cm) Weight: (!) 170 kg BMI (Calculated): 41.17   Intake/Output last 2 shifts:  10/06 0701 - 10/07 0700 In: 5681.8 [I.V.:5681.8] Out: 950 [Emesis/NG output:950]   Physical Exam:  Constitutional: alert, cooperative and no distress  HEENT: NGT in place; output thinning  Respiratory: breathing non-labored at rest  Cardiovascular: regular rate and sinus rhythm  Gastrointestinal: soft, non-tender, distension appreciable to upper abdomen, no rebound/guarding Integumentary: Laparoscopic incisions are CDI with dermabond, no erythema or drainage  Labs:     Latest Ref Rng & Units 04/29/2023    5:22 AM 04/27/2023    4:13 AM 04/25/2023    7:50 AM  CBC  WBC 4.0 - 10.5 K/uL 6.7  7.7  7.9   Hemoglobin 13.0 - 17.0 g/dL 40.9  81.1  91.4   Hematocrit 39.0 - 52.0 % 36.9  35.8  40.0   Platelets 150 - 400 K/uL 249  248  257       Latest Ref Rng & Units 05/01/2023    4:40 AM 04/30/2023    4:46 AM 04/29/2023    5:22 AM  CMP  Glucose 70 - 99 mg/dL 782  96  956   BUN 6 - 20 mg/dL 16  16  18    Creatinine 0.61 - 1.24 mg/dL 2.13  0.86  5.78   Sodium 135 - 145 mmol/L 145  144  144   Potassium 3.5 - 5.1 mmol/L 4.2  3.8  3.5   Chloride 98 - 111 mmol/L 106  103  103   CO2 22 - 32 mmol/L 27  28  30    Calcium 8.9 - 10.3 mg/dL 8.7  8.6  8.5      Imaging studies: No new pertinent imaging studies   Assessment/Plan: 47 y.o. male with likely  post-operative ileus 10 Days Post-Op s/p laparoscopic appendectomy for perforated appendicitis   - okay to start CLD with NGT in, if gets nauseated will reconnect NGT  - Continue IV Reglan, d/c robaxin and add home anti-HTN  - Monitor abdominal examination; on-going bowel function - Pain control prn; antiemetics prn    Baker Pierini, M.D. [facility]

## 2023-05-01 NOTE — Plan of Care (Signed)
  Problem: Clinical Measurements: Goal: Ability to maintain clinical measurements within normal limits will improve Outcome: Progressing Goal: Will remain free from infection Outcome: Progressing Goal: Diagnostic test results will improve Outcome: Progressing   Problem: Nutrition: Goal: Adequate nutrition will be maintained Outcome: Progressing   Problem: Coping: Goal: Level of anxiety will decrease Outcome: Progressing   Problem: Elimination: Goal: Will not experience complications related to bowel motility Outcome: Progressing   Problem: Pain Managment: Goal: General experience of comfort will improve Outcome: Progressing   Problem: Safety: Goal: Ability to remain free from injury will improve Outcome: Progressing

## 2023-05-02 LAB — AEROBIC/ANAEROBIC CULTURE W GRAM STAIN (SURGICAL/DEEP WOUND): Culture: NO GROWTH

## 2023-05-02 MED ORDER — SODIUM CHLORIDE 0.9% FLUSH
10.0000 mL | Freq: Two times a day (BID) | INTRAVENOUS | Status: DC
  Administered 2023-05-02 (×2): 10 mL via INTRAVENOUS

## 2023-05-02 NOTE — Plan of Care (Signed)
  Problem: Health Behavior/Discharge Planning: Goal: Ability to manage health-related needs will improve Outcome: Progressing   Problem: Activity: Goal: Risk for activity intolerance will decrease Outcome: Progressing   Problem: Nutrition: Goal: Adequate nutrition will be maintained Outcome: Progressing   Problem: Elimination: Goal: Will not experience complications related to bowel motility Outcome: Progressing   Problem: Pain Managment: Goal: General experience of comfort will improve Outcome: Progressing

## 2023-05-02 NOTE — Progress Notes (Signed)
[facility] SUR[facility]ICAL PROGRESS NOTE  Hospital Day(s): 9.   Post op day(s): 11 Days Post-Op.   Interval History:  Patient seen and examined No acute events or new complaints overnight.  Patient reports he is doing well this morning No abdominal pain No nausea, emesis  No new labs this morning NGT clamped over 24 hours at this point without issue  He continues to pass flatus and have BM  Vital signs in last 24 hours: [min-max] current  Temp:  [98.1 F (36.7 C)-98.7 F (37.1 C)] 98.7 F (37.1 C) (10/07 2032) Pulse Rate:  [74-82] 82 (10/07 2032) Resp:  [18] 18 (10/07 2032) BP: (139-176)/(86-103) 139/86 (10/07 2032) SpO2:  [96 %-98 %] 98 % (10/07 2032)     Height: 6\' 8"  (203.2 cm) Weight: (!) 170 kg BMI (Calculated): 41.17   Intake/Output last 2 shifts:  No intake/output data recorded.   Physical Exam:  Constitutional: alert, cooperative and no distress  HEENT: NGT removed this morning  Respiratory: breathing non-labored at rest  Cardiovascular: regular rate and sinus rhythm  Gastrointestinal: soft, non-tender, no rebound/guarding Integumentary: Laparoscopic incisions are CDI with dermabond, no erythema or drainage  Labs:     Latest Ref Rng & Units 04/29/2023    5:22 AM 04/27/2023    4:13 AM 04/25/2023    7:50 AM  CBC  WBC 4.0 - 10.5 K/uL 6.7  7.7  7.9   Hemoglobin 13.0 - 17.0 g/dL 24.4  01.0  27.2   Hematocrit 39.0 - 52.0 % 36.9  35.8  40.0   Platelets 150 - 400 K/uL 249  248  257       Latest Ref Rng & Units 05/01/2023    4:40 AM 04/30/2023    4:46 AM 04/29/2023    5:22 AM  CMP  Glucose 70 - 99 mg/dL 536  96  644   BUN 6 - 20 mg/dL 16  16  18    Creatinine 0.61 - 1.24 mg/dL 0.34  7.42  5.95   Sodium 135 - 145 mmol/L 145  144  144   Potassium 3.5 - 5.1 mmol/L 4.2  3.8  3.5   Chloride 98 - 111 mmol/L 106  103  103   CO2 22 - 32 mmol/L 27  28  30    Calcium 8.9 - 10.3 mg/dL 8.7  8.6  8.5      Imaging studies: No new pertinent imaging  studies   Assessment/Plan: 47 y.o. male with clinically improving post-operative ileus 11 Days Post-Op s/p laparoscopic appendectomy for perforated appendicitis   - NGT removed this morning  - Will advance to FLD for lunch today  - Monitor abdominal examination; on-going bowel function - Pain control prn; antiemetics prn   - Continued ambulation encouraged    - Discharge Planning: Making progress, NGT out and diet advancing. Potentially home in 24-48 hours.   All of the above findings and recommendations were discussed with the patient, and the medical team, and all of patient's questions were answered to his expressed satisfaction.  -- Lynden Oxford, PA-C [facility] 05/02/2023, 7:23 AM M-F: 7am - 4pm

## 2023-05-03 MED ORDER — OXYCODONE HCL 5 MG PO TABS: 5.0000 mg | ORAL_TABLET | Freq: Four times a day (QID) | ORAL | 0 refills | Status: AC | PRN

## 2023-05-03 MED ORDER — IBUPROFEN 600 MG PO TABS: 600.0000 mg | ORAL_TABLET | Freq: Four times a day (QID) | ORAL | 0 refills | Status: AC | PRN

## 2023-05-03 NOTE — Discharge Instructions (Signed)
In addition to included general post-operative instructions,  Diet: Resume home diet.   Activity: No heavy lifting >20 pounds (children, pets, laundry, garbage) or strenuous activity for 4 weeks from date of surgery, but light activity and walking are encouraged. Do not drive or drink alcohol if taking narcotic pain medications or having pain that might distract from driving.  Wound care: You may shower/get incision wet with soapy water and pat dry (do not rub incisions), but no baths or submerging incision underwater until follow-up.   Medications: Resume all home medications. For mild to moderate pain: acetaminophen (Tylenol) or ibuprofen/naproxen (if no kidney disease). Combining Tylenol with alcohol can substantially increase your risk of causing liver disease. Narcotic pain medications, if prescribed, can be used for severe pain, though may cause nausea, constipation, and drowsiness. Do not combine Tylenol and Percocet (or similar) within a 6 hour period as Percocet (and similar) contain(s) Tylenol. If you do not need the narcotic pain medication, you do not need to fill the prescription.  Call office [phone] / [phone]) at any time if any questions, worsening pain, fevers/chills, bleeding, drainage from incision site, or other concerns.

## 2023-05-03 NOTE — Discharge Summary (Signed)
[facility] SUR[facility]ICAL DISCHARGE SUMMARY  Patient ID: Andrew Andersen. MRN: [id number] DOB/AGE: 10-10-1975 47 y.o.  Admit date: 04/21/2023 Discharge date: 05/03/2023  Discharge Diagnoses Patient Active Problem List   Diagnosis Date Noted   Acute appendicitis 04/21/2023   Acute perforated appendicitis 04/21/2023    Consultants Interventional Radiology  Procedures 04/21/2023:  Laparoscopic Appendectomy  HPI: 47M with PMH significnat for IBS; anxiety and HTN who presents with two day hx of abdominal pain. Pain initially vague and felt like he pulled a muscle in abdomen that transitioned to sharper right lower quadrant pain. This was accompanied by some diarrhea on Tuesday. He also says he has felt feverish. Denies dysuria. Last colonoscoipy this year and had polyps removed   Hospital Course: Informed consent was obtained and documented, and patient underwent uneventful laparoscopic appendectomy (Dr Maurine Minister, MD, 04/21/2023).  Post-operatively, patient unfortunately developed post-operative ileus. He required NGT decompression. Did undergo CT Abdomen/Pelvis on 10/02 concerning for possible Abscess. IR was consulted and underwent aspiration on 10/03. NGT was ultimately removed on 10/08 after return of bowel function and passing clamping trial. Advancement of patient's diet and ambulation were well-tolerated. The remainder of patient's hospital course was essentially unremarkable, and discharge planning was initiated accordingly with patient safely able to be discharged home with appropriate discharge instructions, pain control, and outpatient follow-up after all of his questions were answered to his expressed satisfaction.   Discharge Condition: Good   Physical Examination:  Constitutional: Well appearing male, NAD Pulmonary: Normal effort, no respiratory distress Gastrointestinal: Soft, non-tender, non-distended, no rebound/guarding Skin: Laparoscopic incisions are CDI  with dermabond, no erythema    Allergies as of 05/03/2023   No Known Allergies      Medication List     TAKE these medications    EPINEPHrine 0.3 mg/0.3 mL Soaj injection Commonly known as: EPI-PEN Inject 0.3 mg into the muscle as needed for anaphylaxis.   ibuprofen 600 MG tablet Commonly known as: ADVIL Take 1 tablet (600 mg total) by mouth every 6 (six) hours as needed.   losartan-hydrochlorothiazide 100-12.5 MG tablet Commonly known as: HYZAAR Take 1 tablet by mouth daily.   oxyCODONE 5 MG immediate release tablet Commonly known as: Oxy IR/ROXICODONE Take 1 tablet (5 mg total) by mouth every 6 (six) hours as needed for severe pain or breakthrough pain.   sertraline 100 MG tablet Commonly known as: ZOLOFT Take 150 mg by mouth daily.   traZODone 100 MG tablet Commonly known as: DESYREL Take 100 mg by mouth at bedtime as needed for sleep.          Follow-up Information     Kandis Cocking, MD. Go on 05/16/2023.   Specialty: General Surgery Why: Go to appointment on 10/22 at 930 AM Contact information: [address] [place] [address] [phone]                  Time spent on discharge management including discussion of hospital course, clinical condition, outpatient instructions, prescriptions, and follow up with the patient and members of the medical team: >30 minutes  -- Lynden Oxford , PA-C [facility]  05/03/2023, 8:19 AM [phone] M-F: 7am - 4pm

## 2023-05-03 NOTE — Plan of Care (Signed)

## 2023-05-03 NOTE — Plan of Care (Signed)
The patient has been discharged. IV has been removed. The patient has been educated on discharge appointment, medications and wound care. The patient has been wheeled down to his vehicle.  Problem: Education: Goal: Knowledge of General Education information will improve Description: Including pain rating scale, medication(s)/side effects and non-pharmacologic comfort measures Outcome: Completed/Met   Problem: Health Behavior/Discharge Planning: Goal: Ability to manage health-related needs will improve Outcome: Completed/Met   Problem: Clinical Measurements: Goal: Ability to maintain clinical measurements within normal limits will improve Outcome: Completed/Met Goal: Will remain free from infection Outcome: Completed/Met Goal: Diagnostic test results will improve Outcome: Completed/Met Goal: Respiratory complications will improve Outcome: Completed/Met Goal: Cardiovascular complication will be avoided Outcome: Completed/Met   Problem: Activity: Goal: Risk for activity intolerance will decrease Outcome: Completed/Met   Problem: Nutrition: Goal: Adequate nutrition will be maintained Outcome: Completed/Met   Problem: Coping: Goal: Level of anxiety will decrease Outcome: Completed/Met   Problem: Elimination: Goal: Will not experience complications related to bowel motility Outcome: Completed/Met Goal: Will not experience complications related to urinary retention Outcome: Completed/Met   Problem: Pain Managment: Goal: General experience of comfort will improve Outcome: Completed/Met   Problem: Safety: Goal: Ability to remain free from injury will improve Outcome: Completed/Met   Problem: Skin Integrity: Goal: Risk for impaired skin integrity will decrease Outcome: Completed/Met

## 2023-05-08 ENCOUNTER — Telehealth: Payer: Self-pay | Admitting: *Deleted

## 2023-05-08 NOTE — Transitions of Care (Post Inpatient/ED Visit) (Signed)
   05/08/2023  Name: Andrew Andersen. MRN: [id number] DOB: 10/09/1975  Today's TOC FU Call Status: Today's TOC FU Call Status:: Unsuccessful Call (1st Attempt) Unsuccessful Call (1st Attempt) Date: 05/08/23  Attempted to reach the patient regarding the most recent Inpatient/ED visit.  Follow Up Plan: Additional outreach attempts will be made to reach the patient to complete the Transitions of Care (Post Inpatient/ED visit) call.   [name] BSN RN [facility] [phone]

## 2023-05-08 NOTE — Transitions of Care (Post Inpatient/ED Visit) (Signed)
05/08/2023  Name: Andrew Andersen. MRN: [id number] DOB: 1976-03-05  Today's TOC FU Call Status: Today's TOC FU Call Status:: Successful TOC FU Call Completed TOC FU Call Complete Date: 05/08/23 Patient's Name and Date of Birth confirmed.  Transition Care Management Follow-up Telephone Call Date of Discharge: 05/03/23 Discharge Facility: [facility]) Type of Discharge: Inpatient Admission Primary Inpatient Discharge Diagnosis:: Appendicitis How have you been since you were released from the hospital?: Better Any questions or concerns?: No  Items Reviewed: Did you receive and understand the discharge instructions provided?: Yes Medications obtained,verified, and reconciled?: Yes (Medications Reviewed) Any new allergies since your discharge?: No Dietary orders reviewed?: Yes Type of Diet Ordered:: low sodium heart healthy Do you have support at home?: Yes People in Home: alone Name of Support/Comfort Primary Source: [name]  Medications Reviewed Today: Medications Reviewed Today     Reviewed by Luella Cook, RN (Case Manager) on 05/08/23 at 1253  Med List Status: <None>   Medication Order Taking? Sig Documenting Provider Last Dose Status Informant  EPINEPHrine 0.3 mg/0.3 mL IJ SOAJ injection [id number] Yes Inject 0.3 mg into the muscle as needed for anaphylaxis. [provider] Taking Active Pharmacy Records  ibuprofen (ADVIL) 600 MG tablet [id number] Yes Take 1 tablet (600 mg total) by mouth every 6 (six) hours as needed. Donovan Kail, PA-C Taking Active   losartan-hydrochlorothiazide [facility]) 100-12.5 MG tablet [id number] Yes Take 1 tablet by mouth daily. Jacky Kindle, FNP Taking Active Self, Pharmacy Records  oxyCODONE (OXY IR/ROXICODONE) 5 MG immediate release tablet [id number] Yes Take 1 tablet (5 mg total) by mouth every 6 (six) hours as needed for severe pain or breakthrough pain. Donovan Kail, PA-C Taking Active    sertraline (ZOLOFT) 100 MG tablet [id number] Yes Take 150 mg by mouth daily. [provider] Taking Active Self, Pharmacy Records  traZODone (DESYREL) 100 MG tablet [id number] Yes Take 100 mg by mouth at bedtime as needed for sleep. [provider] Taking Active Self, Pharmacy Records            Home Care and Equipment/Supplies: Were Home Health Services Ordered?: NA  Functional Questionnaire: Do you need assistance with bathing/showering or dressing?: No Do you need assistance with meal preparation?: No Do you have difficulty maintaining continence: No Do you need assistance with getting out of bed/getting out of a chair/moving?: No Do you have difficulty managing or taking your medications?: No  Follow up appointments reviewed: PCP Follow-up appointment confirmed?: Yes Date of PCP follow-up appointment?: 06/02/23 Follow-up Provider: Merita Norton Specialist [facility] Follow-up appointment confirmed?: Yes Date of Specialist follow-up appointment?: 05/16/23 Follow-Up Specialty Provider:: Dr Maurine Minister Do you need transportation to your follow-up appointment?: No Do you understand care options if your condition(s) worsen?: Yes-patient verbalized understanding   Interventions Today    Flowsheet Row Most Recent Value  Chronic Disease   Chronic disease during today's visit Hypertension (HTN)  General Interventions   General Interventions Discussed/Reviewed General Interventions Discussed, General Interventions Reviewed, Doctor Visits, Community Resources  [facility] discussed working with a Gaffer and weekly calls to follow up with patient and help him with his hypertension. Patient declined.]  Doctor Visits Discussed/Reviewed Doctor Visits Discussed, Doctor Visits Reviewed  Nutrition Interventions   Nutrition Discussed/Reviewed Nutrition Discussed, Nutrition Reviewed  [RN discussed eating low sodium foods]  Pharmacy Interventions   Pharmacy Dicussed/Reviewed  Pharmacy Topics Discussed, Pharmacy Topics Reviewed      TOC Interventions Today    Flowsheet Row Most  Recent Value  TOC Interventions   TOC Interventions Discussed/Reviewed TOC Interventions Discussed, TOC Interventions Reviewed      Patient declined TOC follow up weekly calls and Care Coordination services.   [name] BSN RN [facility] [phone]

## 2023-05-15 DIAGNOSIS — L821 Other seborrheic keratosis: Secondary | ICD-10-CM | POA: Diagnosis not present

## 2023-05-15 DIAGNOSIS — L728 Other follicular cysts of the skin and subcutaneous tissue: Secondary | ICD-10-CM | POA: Diagnosis not present

## 2023-05-15 DIAGNOSIS — D2262 Melanocytic nevi of left upper limb, including shoulder: Secondary | ICD-10-CM | POA: Diagnosis not present

## 2023-05-16 ENCOUNTER — Ambulatory Visit (INDEPENDENT_AMBULATORY_CARE_PROVIDER_SITE_OTHER): Payer: BC Managed Care – PPO | Admitting: General Surgery

## 2023-05-16 ENCOUNTER — Encounter: Payer: Self-pay | Admitting: General Surgery

## 2023-05-16 VITALS — BP 137/78 | HR 94 | Temp 98.0°F | Ht >= 80 in | Wt 365.0 lb

## 2023-05-16 DIAGNOSIS — K3532 Acute appendicitis with perforation and localized peritonitis, without abscess: Secondary | ICD-10-CM

## 2023-05-16 DIAGNOSIS — Z09 Encounter for follow-up examination after completed treatment for conditions other than malignant neoplasm: Secondary | ICD-10-CM

## 2023-05-16 NOTE — Patient Instructions (Signed)

## 2023-05-16 NOTE — Progress Notes (Signed)
Patient returns s/p laparoscopic appendectomy for perforated appendicitis. His post-operative course was complicated by fluid collection requiring IR aspiration and prolonged ileus. He is doing well now. He reports tolerating diet. He is having normal bowel movements but does feel he has to strain a bit more than usual. He also reports having some sharp pain at llq incision when he moves or stands up.   On exam incisions healing well, surgical glue flaking off and no erythema. Abdomen is non-tender, non-distended.   Pathology reviewed with patient.   A/P: S/p lap appy for perforated appendicitis. Overall doing well. Continue lifting restrictions for two more weeks. Tylenol as needed for pain and recommended colace as stool softener. Can follow up with [place] PRN.

## 2023-05-22 DIAGNOSIS — G4733 Obstructive sleep apnea (adult) (pediatric): Secondary | ICD-10-CM | POA: Diagnosis not present

## 2023-05-26 DIAGNOSIS — J3081 Allergic rhinitis due to animal (cat) (dog) hair and dander: Secondary | ICD-10-CM | POA: Diagnosis not present

## 2023-05-26 DIAGNOSIS — J301 Allergic rhinitis due to pollen: Secondary | ICD-10-CM | POA: Diagnosis not present

## 2023-05-26 DIAGNOSIS — J3089 Other allergic rhinitis: Secondary | ICD-10-CM | POA: Diagnosis not present

## 2023-05-30 DIAGNOSIS — J3089 Other allergic rhinitis: Secondary | ICD-10-CM | POA: Diagnosis not present

## 2023-05-30 DIAGNOSIS — J301 Allergic rhinitis due to pollen: Secondary | ICD-10-CM | POA: Diagnosis not present

## 2023-05-30 DIAGNOSIS — J3081 Allergic rhinitis due to animal (cat) (dog) hair and dander: Secondary | ICD-10-CM | POA: Diagnosis not present

## 2023-06-02 ENCOUNTER — Encounter: Payer: BC Managed Care – PPO | Admitting: Family Medicine

## 2023-06-05 DIAGNOSIS — F320 Major depressive disorder, single episode, mild: Secondary | ICD-10-CM | POA: Diagnosis not present

## 2023-06-05 DIAGNOSIS — F409 Phobic anxiety disorder, unspecified: Secondary | ICD-10-CM | POA: Diagnosis not present

## 2023-06-05 DIAGNOSIS — F5105 Insomnia due to other mental disorder: Secondary | ICD-10-CM | POA: Diagnosis not present

## 2023-06-22 DIAGNOSIS — G4733 Obstructive sleep apnea (adult) (pediatric): Secondary | ICD-10-CM | POA: Diagnosis not present

## 2023-06-26 DIAGNOSIS — D2261 Melanocytic nevi of right upper limb, including shoulder: Secondary | ICD-10-CM | POA: Diagnosis not present

## 2023-06-26 DIAGNOSIS — F320 Major depressive disorder, single episode, mild: Secondary | ICD-10-CM | POA: Diagnosis not present

## 2023-06-26 DIAGNOSIS — D2271 Melanocytic nevi of right lower limb, including hip: Secondary | ICD-10-CM | POA: Diagnosis not present

## 2023-06-26 DIAGNOSIS — D225 Melanocytic nevi of trunk: Secondary | ICD-10-CM | POA: Diagnosis not present

## 2023-06-26 DIAGNOSIS — F409 Phobic anxiety disorder, unspecified: Secondary | ICD-10-CM | POA: Diagnosis not present

## 2023-06-26 DIAGNOSIS — D2262 Melanocytic nevi of left upper limb, including shoulder: Secondary | ICD-10-CM | POA: Diagnosis not present

## 2023-07-22 DIAGNOSIS — G4733 Obstructive sleep apnea (adult) (pediatric): Secondary | ICD-10-CM | POA: Diagnosis not present

## 2023-08-24 ENCOUNTER — Other Ambulatory Visit: Payer: Self-pay | Admitting: *Deleted

## 2023-08-24 ENCOUNTER — Telehealth: Payer: Self-pay | Admitting: Family Medicine

## 2023-08-24 DIAGNOSIS — I10 Essential (primary) hypertension: Secondary | ICD-10-CM

## 2023-08-24 MED ORDER — LOSARTAN POTASSIUM-HCTZ 100-12.5 MG PO TABS: 1.0000 | ORAL_TABLET | Freq: Every day | ORAL | 1 refills | Status: DC

## 2023-08-24 NOTE — Telephone Encounter (Signed)
Losartan-hydrochlorothiazide sent to [facility].

## 2023-08-24 NOTE — Telephone Encounter (Signed)
[facility] pharmacy is requesting refill losartan-hydrochlorothiazide (HYZAAR) 100-12.5 MG tablet  Please advise

## 2023-09-28 ENCOUNTER — Ambulatory Visit: Admitting: Physician Assistant

## 2023-09-28 ENCOUNTER — Encounter: Payer: Self-pay | Admitting: Physician Assistant

## 2023-09-28 VITALS — BP 147/89 | HR 96 | Temp 98.1°F | Resp 16 | Ht >= 80 in | Wt >= 6400 oz

## 2023-09-28 DIAGNOSIS — H6120 Impacted cerumen, unspecified ear: Secondary | ICD-10-CM | POA: Diagnosis not present

## 2023-09-28 DIAGNOSIS — J3089 Other allergic rhinitis: Secondary | ICD-10-CM | POA: Diagnosis not present

## 2023-09-28 DIAGNOSIS — F39 Unspecified mood [affective] disorder: Secondary | ICD-10-CM

## 2023-09-28 DIAGNOSIS — I10 Essential (primary) hypertension: Secondary | ICD-10-CM

## 2023-09-28 NOTE — Progress Notes (Signed)
 Established patient visit  Patient: Andrew Andersen.   DOB: Nov 27, 1975   48 y.o. Male  MRN: [id number] Visit Date: 09/28/2023  Today's healthcare provider: Debera Lat, PA-C   Chief Complaint  Patient presents with   Hearing Problem    Has pressure and loss of hearing in left ear!   Subjective     Discussed the use of AI scribe software for clinical note transcription with the patient, who gave verbal consent to proceed.  History of Present Illness   The patient, with a history of allergies managed with allergy shots, presents with a three-week history of ear pressure. He reports that the sensation seems to be activated by water. He has been off allergy shots for the last six months and suspects he might have a mild allergy issue. He also mentions that his blood pressure has been out of control. He is currently on blood pressure medication. He denies taking any antihistamines. He also reports a little phlegm in the mornings.           09/28/2023    2:24 PM 03/03/2023    1:16 PM 06/01/2022    9:19 AM  Depression screen PHQ 2/9  Decreased Interest 0 0 1  Down, Depressed, Hopeless 0 0 0  PHQ - 2 Score 0 0 1  Altered sleeping 0 3 3  Tired, decreased energy 1 2 3   Change in appetite 3 2 3   Feeling bad or failure about yourself  0 0 0  Trouble concentrating 1 0 2  Moving slowly or fidgety/restless 1 0 2  Suicidal thoughts 1 0 0  PHQ-9 Score 7 7 14   Difficult doing work/chores Not difficult at all Not difficult at all Not difficult at all      09/28/2023    2:25 PM  GAD 7 : Generalized Anxiety Score  Nervous, Anxious, on Edge 1  Control/stop worrying 1  Worry too much - different things 1  Trouble relaxing 0  Restless 1  Easily annoyed or irritable 0  Afraid - awful might happen 0  Total GAD 7 Score 4  Anxiety Difficulty Not difficult at all    Medications: Outpatient Medications Prior to Visit  Medication Sig   EPINEPHrine 0.3 mg/0.3 mL IJ SOAJ injection Inject 0.3  mg into the muscle as needed for anaphylaxis.   ibuprofen (ADVIL) 600 MG tablet Take 1 tablet (600 mg total) by mouth every 6 (six) hours as needed.   losartan-hydrochlorothiazide (HYZAAR) 100-12.5 MG tablet Take 1 tablet by mouth daily.   sertraline (ZOLOFT) 100 MG tablet Take 150 mg by mouth daily.   traZODone (DESYREL) 100 MG tablet Take 100 mg by mouth at bedtime as needed for sleep.   oxyCODONE (OXY IR/ROXICODONE) 5 MG immediate release tablet Take 1 tablet (5 mg total) by mouth every 6 (six) hours as needed for severe pain or breakthrough pain. (Patient not taking: Reported on 09/28/2023)   No facility-administered medications prior to visit.    Review of Systems All negative Except see HPI       Objective    BP (!) 147/89 (BP Location: Left Arm, Patient Position: Sitting, Cuff Size: Large)   Pulse 96   Temp 98.1 F (36.7 C)   Resp 16   Ht 6\' 8"  (2.032 m)   Wt (!) 402 lb 1.6 oz (182.4 kg)   SpO2 99%   BMI 44.17 kg/m     Physical Exam Vitals reviewed.  Constitutional:      General: He  is in acute distress.     Appearance: Normal appearance. He is not ill-appearing, toxic-appearing or diaphoretic.  HENT:     Head: Normocephalic and atraumatic.     Right Ear: Tympanic membrane, ear canal and external ear normal.     Left Ear: Tympanic membrane, ear canal and external ear normal.     Nose: Congestion and rhinorrhea present.     Mouth/Throat:     Pharynx: Posterior oropharyngeal erythema present.  Eyes:     General: No scleral icterus.       Right eye: No discharge.        Left eye: No discharge.     Extraocular Movements: Extraocular movements intact.     Conjunctiva/sclera: Conjunctivae normal.     Pupils: Pupils are equal, round, and reactive to light.  Cardiovascular:     Rate and Rhythm: Normal rate and regular rhythm.     Pulses: Normal pulses.     Heart sounds: Normal heart sounds. No murmur heard. Pulmonary:     Effort: Pulmonary effort is normal. No  respiratory distress.     Breath sounds: Normal breath sounds. No wheezing or rhonchi.  Abdominal:     General: Abdomen is flat. Bowel sounds are normal.     Palpations: Abdomen is soft.  Musculoskeletal:        General: Normal range of motion.     Cervical back: Normal range of motion and neck supple.     Right lower leg: No edema.     Left lower leg: No edema.  Lymphadenopathy:     Cervical: No cervical adenopathy.  Skin:    General: Skin is warm and dry.     Findings: No rash.  Neurological:     General: No focal deficit present.     Mental Status: He is alert and oriented to person, place, and time. Mental status is at baseline.  Psychiatric:        Behavior: Behavior normal.        Thought Content: Thought content normal.      No results found for any visits on 09/28/23.      Assessment and Plan    Impacted Cerumen Ear pressure due to impacted cerumen confirmed. Advised against self-cleaning to prevent eardrum injury. - Recommend Debrox (carbamide peroxide) drops to soften ear wax. - Schedule ENT appointment for professional cleaning if Debrox is ineffective.  Hypertension Chronic, unstable Blood pressure at 147/89 mmHg.  Could be due to whitecoat syndrome, continue current antihypertensive medication hyzaar 100-12.5 but lacks regular home monitoring. - Instruct to measure blood pressure twice daily for two weeks and record readings. - Bring blood pressure monitor and recorded readings to next appointment for evaluation and potential medication adjustment. Low sodium diet and exercise advised. Will follow-up with his PCP in 2-4 weeks  Nasal Congestion Due to possible allergic rhinitis  Mild nasal congestion possibly allergy-related. Inconsistent allergy shots and not on antihistamines. - Recommend second-generation antihistamines such as Allegra, Claritin, or Zyrtec based on symptom severity. - Suggest Flonase (fluticasone) nasal spray or saline nasal rinses for  additional relief.       No orders of the defined types were placed in this encounter.   No follow-ups on file.   The patient was advised to call back or seek an in-person evaluation if the symptoms worsen or if the condition fails to improve as anticipated.  I discussed the assessment and treatment plan with the patient. The patient was provided an opportunity to  ask questions and all were answered. The patient agreed with the plan and demonstrated an understanding of the instructions.  I, Debera Lat, PA-C have reviewed all documentation for this visit. The documentation on 09/28/2023  for the exam, diagnosis, procedures, and orders are all accurate and complete.  Debera Lat, [facility], MMS [facility] [phone] (phone) [phone] (fax)  [facility]

## 2023-09-29 NOTE — Progress Notes (Incomplete)
 Established patient visit  Patient: Andrew Andersen.   DOB: 1976/06/18   48 y.o. Male  MRN: [id number] Visit Date: 09/28/2023  Today's healthcare provider: Debera Lat, PA-C   Chief Complaint  Patient presents with  . Hearing Problem    Has pressure and loss of hearing in left ear!   Subjective     Discussed the use of AI scribe software for clinical note transcription with the patient, who gave verbal consent to proceed.  History of Present Illness   The patient, with a history of allergies managed with allergy shots, presents with a three-week history of ear pressure. He reports that the sensation seems to be activated by water. He has been off allergy shots for the last six months and suspects he might have a mild allergy issue. He also mentions that his blood pressure has been out of control. He is currently on blood pressure medication, trazodone at night, and Zoloft for anxiety. He denies taking any antihistamines. He also reports a little phlegm in the mornings.           09/28/2023    2:24 PM 03/03/2023    1:16 PM 06/01/2022    9:19 AM  Depression screen PHQ 2/9  Decreased Interest 0 0 1  Down, Depressed, Hopeless 0 0 0  PHQ - 2 Score 0 0 1  Altered sleeping 0 3 3  Tired, decreased energy 1 2 3   Change in appetite 3 2 3   Feeling bad or failure about yourself  0 0 0  Trouble concentrating 1 0 2  Moving slowly or fidgety/restless 1 0 2  Suicidal thoughts 1 0 0  PHQ-9 Score 7 7 14   Difficult doing work/chores Not difficult at all Not difficult at all Not difficult at all      09/28/2023    2:25 PM  GAD 7 : Generalized Anxiety Score  Nervous, Anxious, on Edge 1  Control/stop worrying 1  Worry too much - different things 1  Trouble relaxing 0  Restless 1  Easily annoyed or irritable 0  Afraid - awful might happen 0  Total GAD 7 Score 4  Anxiety Difficulty Not difficult at all    Medications: Outpatient Medications Prior to Visit  Medication Sig  .  EPINEPHrine 0.3 mg/0.3 mL IJ SOAJ injection Inject 0.3 mg into the muscle as needed for anaphylaxis.  Marland Kitchen ibuprofen (ADVIL) 600 MG tablet Take 1 tablet (600 mg total) by mouth every 6 (six) hours as needed.  Marland Kitchen losartan-hydrochlorothiazide (HYZAAR) 100-12.5 MG tablet Take 1 tablet by mouth daily.  . sertraline (ZOLOFT) 100 MG tablet Take 150 mg by mouth daily.  . traZODone (DESYREL) 100 MG tablet Take 100 mg by mouth at bedtime as needed for sleep.  Marland Kitchen oxyCODONE (OXY IR/ROXICODONE) 5 MG immediate release tablet Take 1 tablet (5 mg total) by mouth every 6 (six) hours as needed for severe pain or breakthrough pain. (Patient not taking: Reported on 09/28/2023)   No facility-administered medications prior to visit.    Review of Systems All negative Except see HPI   {Insert previous labs (optional):23779} {See past labs  Heme  Chem  Endocrine  Serology  Results Review (optional):1}   Objective    BP (!) 147/89 (BP Location: Left Arm, Patient Position: Sitting, Cuff Size: Large)   Pulse 96   Temp 98.1 F (36.7 C)   Resp 16   Ht 6\' 8"  (2.032 m)   Wt (!) 402 lb 1.6 oz (182.4 kg)  SpO2 99%   BMI 44.17 kg/m  {Insert last BP/Wt (optional):23777}{See vitals history (optional):1}   Physical Exam Vitals reviewed.  Constitutional:      General: He is in acute distress.     Appearance: Normal appearance. He is not ill-appearing, toxic-appearing or diaphoretic.  HENT:     Head: Normocephalic and atraumatic.     Right Ear: Tympanic membrane, ear canal and external ear normal.     Left Ear: Tympanic membrane, ear canal and external ear normal.     Nose: Congestion and rhinorrhea present.     Mouth/Throat:     Pharynx: Posterior oropharyngeal erythema present.  Eyes:     General: No scleral icterus.       Right eye: No discharge.        Left eye: No discharge.     Extraocular Movements: Extraocular movements intact.     Conjunctiva/sclera: Conjunctivae normal.     Pupils: Pupils are  equal, round, and reactive to light.  Cardiovascular:     Rate and Rhythm: Normal rate and regular rhythm.     Pulses: Normal pulses.     Heart sounds: Normal heart sounds. No murmur heard. Pulmonary:     Effort: Pulmonary effort is normal. No respiratory distress.     Breath sounds: Normal breath sounds. No wheezing or rhonchi.  Abdominal:     General: Abdomen is flat. Bowel sounds are normal.     Palpations: Abdomen is soft.  Musculoskeletal:        General: Normal range of motion.     Cervical back: Normal range of motion and neck supple.     Right lower leg: No edema.     Left lower leg: No edema.  Lymphadenopathy:     Cervical: No cervical adenopathy.  Skin:    General: Skin is warm and dry.     Findings: No rash.  Neurological:     General: No focal deficit present.     Mental Status: He is alert and oriented to person, place, and time. Mental status is at baseline.  Psychiatric:        Behavior: Behavior normal.        Thought Content: Thought content normal.      No results found for any visits on 09/28/23.      Assessment and Plan    Impacted Cerumen Ear pressure due to impacted cerumen confirmed. Advised against self-cleaning to prevent eardrum injury. - Recommend Debrox (carbamide peroxide) drops to soften ear wax. - Schedule ENT appointment for professional cleaning if Debrox is ineffective.  Hypertension Chronic, unstable Blood pressure at 147/89 mmHg.  Could be due to whitecoat syndrome versus continue current antihypertensive medication hyzaar 100-12.5 but lacks regular home monitoring. - Instruct to measure blood pressure twice daily for two weeks and record readings. - Bring blood pressure monitor and recorded readings to next appointment for evaluation and potential medication adjustment. Low sodium diet and exercise advised. Will follow-up with his PCP in 2-4 weeks  Nasal Congestion Due to possible allergic rhinitis  Mild nasal congestion possibly  allergy-related. Inconsistent allergy shots and not on antihistamines. - Recommend second-generation antihistamines such as Allegra, Claritin, or Zyrtec based on symptom severity. - Suggest Flonase (fluticasone) nasal spray or saline nasal rinses for additional relief.     Mood disorder (HCC) Chronic Phq9 7 gad7 4 Continue trazodone 100 Mg and sertraline 100 Mg Will follow-up with pcp   No orders of the defined types were placed in this encounter.  No follow-ups on file.   The patient was advised to call back or seek an in-person evaluation if the symptoms worsen or if the condition fails to improve as anticipated.  I discussed the assessment and treatment plan with the patient. The patient was provided an opportunity to ask questions and all were answered. The patient agreed with the plan and demonstrated an understanding of the instructions.  I, Debera Lat, PA-C have reviewed all documentation for this visit. The documentation on 09/28/2023  for the exam, diagnosis, procedures, and orders are all accurate and complete.  Debera Lat, [facility], MMS [facility] [phone] (phone) [phone] (fax)  [facility]

## 2023-09-30 ENCOUNTER — Encounter: Payer: Self-pay | Admitting: Physician Assistant

## 2023-10-02 DIAGNOSIS — H902 Conductive hearing loss, unspecified: Secondary | ICD-10-CM | POA: Diagnosis not present

## 2023-10-02 DIAGNOSIS — H6122 Impacted cerumen, left ear: Secondary | ICD-10-CM | POA: Diagnosis not present

## 2023-10-08 NOTE — Progress Notes (Unsigned)
 Established patient visit  Patient: Andrew Andersen.   DOB: 09-15-1975   48 y.o. Male  MRN: [id number] Visit Date: 10/12/2023  Today's healthcare provider: Debera Lat, PA-C   No chief complaint on file.  Subjective       Discussed the use of AI scribe software for clinical note transcription with the patient, who gave verbal consent to proceed.  History of Present Illness               09/28/2023    2:24 PM 03/03/2023    1:16 PM 06/01/2022    9:19 AM  Depression screen PHQ 2/9  Decreased Interest 0 0 1  Down, Depressed, Hopeless 0 0 0  PHQ - 2 Score 0 0 1  Altered sleeping 0 3 3  Tired, decreased energy 1 2 3   Change in appetite 3 2 3   Feeling bad or failure about yourself  0 0 0  Trouble concentrating 1 0 2  Moving slowly or fidgety/restless 1 0 2  Suicidal thoughts 1 0 0  PHQ-9 Score 7 7 14   Difficult doing work/chores Not difficult at all Not difficult at all Not difficult at all      09/28/2023    2:25 PM  GAD 7 : Generalized Anxiety Score  Nervous, Anxious, on Edge 1  Control/stop worrying 1  Worry too much - different things 1  Trouble relaxing 0  Restless 1  Easily annoyed or irritable 0  Afraid - awful might happen 0  Total GAD 7 Score 4  Anxiety Difficulty Not difficult at all    Medications: Outpatient Medications Prior to Visit  Medication Sig   EPINEPHrine 0.3 mg/0.3 mL IJ SOAJ injection Inject 0.3 mg into the muscle as needed for anaphylaxis.   ibuprofen (ADVIL) 600 MG tablet Take 1 tablet (600 mg total) by mouth every 6 (six) hours as needed.   losartan-hydrochlorothiazide (HYZAAR) 100-12.5 MG tablet Take 1 tablet by mouth daily.   oxyCODONE (OXY IR/ROXICODONE) 5 MG immediate release tablet Take 1 tablet (5 mg total) by mouth every 6 (six) hours as needed for severe pain or breakthrough pain. (Patient not taking: Reported on 09/28/2023)   sertraline (ZOLOFT) 100 MG tablet Take 150 mg by mouth daily.   traZODone (DESYREL) 100 MG tablet Take 100  mg by mouth at bedtime as needed for sleep.   No facility-administered medications prior to visit.    Review of Systems All negative Except see HPI   {Insert previous labs (optional):23779} {See past labs  Heme  Chem  Endocrine  Serology  Results Review (optional):1}   Objective    There were no vitals taken for this visit. {Insert last BP/Wt (optional):23777}{See vitals history (optional):1}   Physical Exam   No results found for any visits on 10/12/23.      Assessment and Plan             No orders of the defined types were placed in this encounter.   No follow-ups on file.   The patient was advised to call back or seek an in-person evaluation if the symptoms worsen or if the condition fails to improve as anticipated.  I discussed the assessment and treatment plan with the patient. The patient was provided an opportunity to ask questions and all were answered. The patient agreed with the plan and demonstrated an understanding of the instructions.  I, Debera Lat, PA-C have reviewed all documentation for this visit. The documentation on 10/12/2023  for the exam, diagnosis,  procedures, and orders are all accurate and complete.  Debera Lat, [facility], MMS [facility] [phone] (phone) [phone] (fax)  [facility]

## 2023-10-12 ENCOUNTER — Ambulatory Visit: Admitting: Physician Assistant

## 2023-10-12 ENCOUNTER — Encounter: Payer: Self-pay | Admitting: Physician Assistant

## 2023-10-12 VITALS — BP 139/95 | HR 75 | Temp 98.2°F | Resp 16 | Ht >= 80 in | Wt >= 6400 oz

## 2023-10-12 DIAGNOSIS — R739 Hyperglycemia, unspecified: Secondary | ICD-10-CM

## 2023-10-12 DIAGNOSIS — E782 Mixed hyperlipidemia: Secondary | ICD-10-CM

## 2023-10-12 DIAGNOSIS — I10 Essential (primary) hypertension: Secondary | ICD-10-CM

## 2023-10-12 DIAGNOSIS — J3089 Other allergic rhinitis: Secondary | ICD-10-CM

## 2023-10-12 DIAGNOSIS — H6120 Impacted cerumen, unspecified ear: Secondary | ICD-10-CM

## 2023-10-12 DIAGNOSIS — F419 Anxiety disorder, unspecified: Secondary | ICD-10-CM

## 2023-10-12 MED ORDER — HYDROCHLOROTHIAZIDE 12.5 MG PO TABS
12.5000 mg | ORAL_TABLET | Freq: Every day | ORAL | 3 refills | Status: DC
Start: 2023-10-12 — End: 2023-11-21

## 2023-10-13 ENCOUNTER — Encounter: Payer: Self-pay | Admitting: Physician Assistant

## 2023-10-13 LAB — CBC WITH DIFFERENTIAL/PLATELET
Basophils Absolute: 0 10*3/uL (ref 0.0–0.2)
Basos: 1 %
EOS (ABSOLUTE): 0.1 10*3/uL (ref 0.0–0.4)
Eos: 3 %
Hematocrit: 40.3 % (ref 37.5–51.0)
Hemoglobin: 13.6 g/dL (ref 13.0–17.7)
Immature Grans (Abs): 0 10*3/uL (ref 0.0–0.1)
Immature Granulocytes: 1 %
Lymphocytes Absolute: 0.9 10*3/uL (ref 0.7–3.1)
Lymphs: 22 %
MCH: 29.4 pg (ref 26.6–33.0)
MCHC: 33.7 g/dL (ref 31.5–35.7)
MCV: 87 fL (ref 79–97)
Monocytes Absolute: 0.4 10*3/uL (ref 0.1–0.9)
Monocytes: 10 %
Neutrophils Absolute: 2.6 10*3/uL (ref 1.4–7.0)
Neutrophils: 63 %
Platelets: 175 10*3/uL (ref 150–450)
RBC: 4.62 x10E6/uL (ref 4.14–5.80)
RDW: 14.7 % (ref 11.6–15.4)
WBC: 4.1 10*3/uL (ref 3.4–10.8)

## 2023-10-13 LAB — HEMOGLOBIN A1C
Est. average glucose Bld gHb Est-mCnc: 128 mg/dL
Hgb A1c MFr Bld: 6.1 % — ABNORMAL HIGH (ref 4.8–5.6)

## 2023-10-13 LAB — LIPID PANEL
Chol/HDL Ratio: 5.1 ratio — ABNORMAL HIGH (ref 0.0–5.0)
Cholesterol, Total: 215 mg/dL — ABNORMAL HIGH (ref 100–199)
HDL: 42 mg/dL (ref >39)
LDL Chol Calc (NIH): 132 mg/dL — ABNORMAL HIGH (ref 0–99)
Triglycerides: 227 mg/dL — ABNORMAL HIGH (ref 0–149)
VLDL Cholesterol Cal: 41 mg/dL — ABNORMAL HIGH (ref 5–40)

## 2023-10-13 LAB — COMPREHENSIVE METABOLIC PANEL
ALT: 33 IU/L (ref 0–44)
AST: 26 IU/L (ref 0–40)
Albumin: 4.6 g/dL (ref 4.1–5.1)
Alkaline Phosphatase: 62 IU/L (ref 44–121)
BUN/Creatinine Ratio: 11 (ref 9–20)
BUN: 13 mg/dL (ref 6–24)
Bilirubin Total: 0.5 mg/dL (ref 0.0–1.2)
CO2: 26 mmol/L (ref 20–29)
Calcium: 9.2 mg/dL (ref 8.7–10.2)
Chloride: 104 mmol/L (ref 96–106)
Creatinine, Ser: 1.14 mg/dL (ref 0.76–1.27)
Globulin, Total: 2.3 g/dL (ref 1.5–4.5)
Glucose: 104 mg/dL — ABNORMAL HIGH (ref 70–99)
Potassium: 4.1 mmol/L (ref 3.5–5.2)
Sodium: 143 mmol/L (ref 134–144)
Total Protein: 6.9 g/dL (ref 6.0–8.5)
eGFR: 80 mL/min/{1.73_m2} (ref >59)

## 2023-10-13 LAB — TSH: TSH: 1.78 u[IU]/mL (ref 0.450–4.500)

## 2023-10-13 NOTE — Progress Notes (Signed)
 If pt agrees please, place atorvastatin 10mg  daily at bedtime, #90, no refills

## 2023-10-16 ENCOUNTER — Other Ambulatory Visit: Payer: Self-pay

## 2023-10-16 DIAGNOSIS — F5105 Insomnia due to other mental disorder: Secondary | ICD-10-CM | POA: Diagnosis not present

## 2023-10-16 DIAGNOSIS — F320 Major depressive disorder, single episode, mild: Secondary | ICD-10-CM | POA: Diagnosis not present

## 2023-10-16 DIAGNOSIS — F409 Phobic anxiety disorder, unspecified: Secondary | ICD-10-CM | POA: Diagnosis not present

## 2023-10-16 MED ORDER — ATORVASTATIN CALCIUM 10 MG PO TABS: 10.0000 mg | ORAL_TABLET | Freq: Every day | ORAL | 3 refills | Status: AC

## 2023-11-21 ENCOUNTER — Ambulatory Visit (INDEPENDENT_AMBULATORY_CARE_PROVIDER_SITE_OTHER): Admitting: Family Medicine

## 2023-11-21 ENCOUNTER — Encounter: Payer: Self-pay | Admitting: Family Medicine

## 2023-11-21 VITALS — BP 131/91 | HR 76 | Ht >= 80 in | Wt >= 6400 oz

## 2023-11-21 DIAGNOSIS — E782 Mixed hyperlipidemia: Secondary | ICD-10-CM

## 2023-11-21 DIAGNOSIS — F39 Unspecified mood [affective] disorder: Secondary | ICD-10-CM | POA: Diagnosis not present

## 2023-11-21 DIAGNOSIS — I10 Essential (primary) hypertension: Secondary | ICD-10-CM | POA: Diagnosis not present

## 2023-11-21 DIAGNOSIS — G4733 Obstructive sleep apnea (adult) (pediatric): Secondary | ICD-10-CM

## 2023-11-21 MED ORDER — LOSARTAN POTASSIUM-HCTZ 100-25 MG PO TABS
1.0000 | ORAL_TABLET | Freq: Every day | ORAL | 2 refills | Status: AC
Start: 2023-11-21 — End: ?

## 2023-11-21 NOTE — Progress Notes (Unsigned)
 Established patient visit   Patient: Andrew Andersen.   DOB: 1975/07/26   48 y.o. Male  MRN: [id number] Visit Date: 11/21/2023  Today's healthcare provider: Mimi Alt, MD   Chief Complaint  Patient presents with   Hypertension    Bp has been uncontrollable since he had covid    Subjective     HPI     Hypertension    Additional comments: Bp has been uncontrollable since he had covid       Last edited by Bart Lieu, CMA on 11/21/2023  3:19 PM.       Discussed the use of AI scribe software for clinical note transcription with the patient, who gave verbal consent to proceed.  History of Present Illness Andrew Abbs. is a 48 year old male with hypertension who presents for chronic management follow-up.  His blood pressure has been uncontrolled since his COVID-19 diagnosis. He is currently taking hydrochlorothiazide  12.5 mg and losartan  100 mg daily. Home blood pressure readings range from 130-140/90-100 mmHg. He has not been monitoring his blood pressure consistently due to recent travel and mentions some difficulty in keeping track.  He has a history of obstructive sleep apnea and uses a CPAP machine, although he finds it uncomfortable and has not been using it consistently. He notes that while the CPAP may help, it is not a significant improvement in his sleep quality. He describes himself as a 'terrible sleeper' and mentions that the CPAP device often gets dislodged during the night.  He is being treated for a mood disorder with Zoloft  100 mg daily and takes trazodone  100 mg as needed for insomnia. He experiences significant anxiety, particularly when his blood pressure is elevated, which sometimes makes it difficult for him to leave the house. He sees a therapist every three months and feels he is managing well overall.  For hyperlipidemia, he is on Crestor 10 mg daily. He mentions a significant weight loss of 47 pounds following a surgery in  September, during which he was hospitalized for almost two weeks. He attributes the weight loss to being unable to eat or drink due to a nasogastric tube and notes difficulty in losing weight since then.     Past Medical History:  Diagnosis Date   Allergy    Depression    Hypertension    Seasonal allergies     Medications: Outpatient Medications Prior to Visit  Medication Sig   atorvastatin  (LIPITOR) 10 MG tablet Take 1 tablet (10 mg total) by mouth daily.   EPINEPHrine  0.3 mg/0.3 mL IJ SOAJ injection Inject 0.3 mg into the muscle as needed for anaphylaxis.   ibuprofen  (ADVIL ) 600 MG tablet Take 1 tablet (600 mg total) by mouth every 6 (six) hours as needed.   sertraline  (ZOLOFT ) 100 MG tablet Take 150 mg by mouth daily.   traZODone  (DESYREL ) 100 MG tablet Take 100 mg by mouth at bedtime as needed for sleep.   [DISCONTINUED] hydrochlorothiazide  (HYDRODIURIL ) 12.5 MG tablet Take 1 tablet (12.5 mg total) by mouth daily.   [DISCONTINUED] losartan -hydrochlorothiazide  (HYZAAR) 100-12.5 MG tablet Take 1 tablet by mouth daily.   oxyCODONE  (OXY IR/ROXICODONE ) 5 MG immediate release tablet Take 1 tablet (5 mg total) by mouth every 6 (six) hours as needed for severe pain or breakthrough pain. (Patient not taking: Reported on 11/21/2023)   No facility-administered medications prior to visit.    Review of Systems  Last CBC Lab Results  Component Value Date  WBC 4.1 10/12/2023   HGB 13.6 10/12/2023   HCT 40.3 10/12/2023   MCV 87 10/12/2023   MCH 29.4 10/12/2023   RDW 14.7 10/12/2023   PLT 175 10/12/2023   Last metabolic panel Lab Results  Component Value Date   GLUCOSE 104 (H) 10/12/2023   NA 143 10/12/2023   K 4.1 10/12/2023   CL 104 10/12/2023   CO2 26 10/12/2023   BUN 13 10/12/2023   CREATININE 1.14 10/12/2023   EGFR 80 10/12/2023   CALCIUM  9.2 10/12/2023   PROT 6.9 10/12/2023   ALBUMIN 4.6 10/12/2023   LABGLOB 2.3 10/12/2023   AGRATIO 2.5 (H) 06/29/2022   BILITOT 0.5  10/12/2023   ALKPHOS 62 10/12/2023   AST 26 10/12/2023   ALT 33 10/12/2023   ANIONGAP 12 05/01/2023   Last lipids Lab Results  Component Value Date   CHOL 215 (H) 10/12/2023   HDL 42 10/12/2023   LDLCALC 132 (H) 10/12/2023   TRIG 227 (H) 10/12/2023   CHOLHDL 5.1 (H) 10/12/2023   The 10-year ASCVD risk score (Arnett DK, et al., 2019) is: 3.8%  Lab Results  Component Value Date   HGBA1C 6.1 (H) 10/12/2023   Last thyroid functions Lab Results  Component Value Date   TSH 1.780 10/12/2023   Last vitamin D No results found for: "25OHVITD2", "25OHVITD3", "VD25OH" Last vitamin B12 and Folate No results found for: "VITAMINB12", "FOLATE"      Objective    BP (!) 131/91   Pulse 76   Ht 6\' 8"  (2.032 m)   Wt (!) 402 lb (182.3 kg)   SpO2 97%   BMI 44.16 kg/m   BP Readings from Last 3 Encounters:  11/21/23 (!) 131/91  10/12/23 (!) 139/95  09/28/23 (!) 147/89   Wt Readings from Last 3 Encounters:  11/21/23 (!) 402 lb (182.3 kg)  10/12/23 (!) 406 lb 12.8 oz (184.5 kg)  09/28/23 (!) 402 lb 1.6 oz (182.4 kg)       Physical Exam  General: Alert, no acute distress Cardio: Normal S1 and S2, RRR, no r/m/g Pulm: CTAB, normal work of breathing ABD: soft, normal bowel sounds     No results found for any visits on 11/21/23.  Assessment & Plan     Problem List Items Addressed This Visit       Cardiovascular and Mediastinum   Primary hypertension - Primary   Chronic Hypertension remains uncontrolled since COVID-19 diagnosis. Current regimen includes hydrochlorothiazide  12.5 mg and losartan  100 mg daily. Home blood pressure readings range from 130-140/90-100 mmHg, indicating suboptimal control. - Increase hydrochlorothiazide  to 25 mg daily. - Continue losartan  100 mg daily. - Monitor blood pressure at home and record readings. - Follow up in two weeks to reassess blood pressure control. - Discussed option to take current combination twice daily to use up existing supply  before switching to new prescription.      Relevant Medications   losartan -hydrochlorothiazide  (HYZAAR) 100-25 MG tablet     Respiratory   OSA (obstructive sleep apnea)   Uses CPAP machine but reports inconsistent use and discomfort. Sleep quality not significantly improved with CPAP. - Refer to sleep specialist for further evaluation and management.      Relevant Orders   Ambulatory referral to Sleep Studies     Other   Mood disorder (HCC)   Chronic anxiety Mood disorder managed with Zoloft  100 mg daily. Reports well-managed mood but experiences anxiety, particularly with elevated blood pressure. Attends therapy every three months and reports  doing well. - Continue Zoloft  100 mg daily. - Continue therapy sessions every three months.      Mixed hyperlipidemia   Hyperlipidemia Chronic problem  - continue Crestor 10 mg daily.      Relevant Medications   losartan -hydrochlorothiazide  (HYZAAR) 100-25 MG tablet     Assessment & Plan        Return in about 1 month (around 12/21/2023) for HTN.         Mimi Alt, MD  [facility] [phone] (phone) [phone] (fax)  [facility]

## 2023-11-22 NOTE — Assessment & Plan Note (Signed)
 Hyperlipidemia Chronic problem  - continue Crestor 10 mg daily.

## 2023-11-22 NOTE — Assessment & Plan Note (Signed)
 Uses CPAP machine but reports inconsistent use and discomfort. Sleep quality not significantly improved with CPAP. - Refer to sleep specialist for further evaluation and management.

## 2023-11-22 NOTE — Assessment & Plan Note (Signed)
 Chronic anxiety Mood disorder managed with Zoloft  100 mg daily. Reports well-managed mood but experiences anxiety, particularly with elevated blood pressure. Attends therapy every three months and reports doing well. - Continue Zoloft  100 mg daily. - Continue therapy sessions every three months.

## 2023-11-22 NOTE — Assessment & Plan Note (Signed)
 Chronic Hypertension remains uncontrolled since COVID-19 diagnosis. Current regimen includes hydrochlorothiazide  12.5 mg and losartan  100 mg daily. Home blood pressure readings range from 130-140/90-100 mmHg, indicating suboptimal control. - Increase hydrochlorothiazide  to 25 mg daily. - Continue losartan  100 mg daily. - Monitor blood pressure at home and record readings. - Follow up in two weeks to reassess blood pressure control. - Discussed option to take current combination twice daily to use up existing supply before switching to new prescription.

## 2023-11-23 ENCOUNTER — Ambulatory Visit (INDEPENDENT_AMBULATORY_CARE_PROVIDER_SITE_OTHER): Admitting: Sleep Medicine

## 2023-11-23 ENCOUNTER — Encounter: Payer: Self-pay | Admitting: Sleep Medicine

## 2023-11-23 VITALS — BP 138/82 | HR 91 | Temp 97.1°F | Ht >= 80 in | Wt >= 6400 oz

## 2023-11-23 DIAGNOSIS — I10 Essential (primary) hypertension: Secondary | ICD-10-CM | POA: Diagnosis not present

## 2023-11-23 DIAGNOSIS — Z87891 Personal history of nicotine dependence: Secondary | ICD-10-CM

## 2023-11-23 DIAGNOSIS — Z6841 Body Mass Index (BMI) 40.0 and over, adult: Secondary | ICD-10-CM

## 2023-11-23 DIAGNOSIS — G4733 Obstructive sleep apnea (adult) (pediatric): Secondary | ICD-10-CM

## 2023-11-23 NOTE — Progress Notes (Signed)
 Name:Andrew Andersen. MRN: [id number] DOB: 1975-08-22   CHIEF COMPLAINT:  CPAP F/U   HISTORY OF PRESENT ILLNESS:  Mr. Weisinger is a 48 y.o. w/ a h/o OSA, hyperlipidemia and morbid obesity who presents  for CPAP follow up visit. Reports difficulty using CPAP therapy due to mask discomfort and restless sleep. Reports using a full face mask previously. States that he would like to try another mask and resume CPAP therapy.   EPWORTH SLEEP SCORE 1    06/01/2022    9:00 AM  Results of the Epworth flowsheet  Sitting and reading 1  Watching TV 0  Sitting, inactive in a public place (e.g. a theatre or a meeting) 0  As a passenger in a car for an hour without a break 0  Lying down to rest in the afternoon when circumstances permit 0  Sitting and talking to someone 0  Sitting quietly after a lunch without alcohol 0  In a car, while stopped for a few minutes in traffic 0  Total score 1     PAST MEDICAL HISTORY :   has a past medical history of Allergy, Depression, Hypertension, and Seasonal allergies.  has a past surgical history that includes Knee surgery (Left); Colonoscopy with propofol  (N/A, 08/09/2019); Colonoscopy with propofol  (N/A, 08/02/2022); Colonoscopy with propofol  (N/A, 11/10/2022); and laparoscopic appendectomy (N/A, 04/21/2023). Prior to Admission medications   Medication Sig Start Date End Date Taking? Authorizing Provider  atorvastatin  (LIPITOR) 10 MG tablet Take 1 tablet (10 mg total) by mouth daily. 10/16/23  Yes Ostwalt, Janna, PA-C  EPINEPHrine  0.3 mg/0.3 mL IJ SOAJ injection Inject 0.3 mg into the muscle as needed for anaphylaxis.   Yes [provider]  ibuprofen  (ADVIL ) 600 MG tablet Take 1 tablet (600 mg total) by mouth every 6 (six) hours as needed. 05/03/23  Yes Schulz, Zachary R, PA-C  losartan -hydrochlorothiazide  (HYZAAR) 100-25 MG tablet Take 1 tablet by mouth daily. 11/21/23  Yes Simmons-Robinson, Judyann Number, MD  oxyCODONE  (OXY IR/ROXICODONE ) 5 MG  immediate release tablet Take 1 tablet (5 mg total) by mouth every 6 (six) hours as needed for severe pain or breakthrough pain. Patient not taking: Reported on 11/23/2023 05/03/23   Schulz, Zachary R, PA-C  sertraline  (ZOLOFT ) 100 MG tablet Take 150 mg by mouth daily.   Yes [provider]  traZODone  (DESYREL ) 100 MG tablet Take 100 mg by mouth at bedtime as needed for sleep.   Yes [provider]   No Known Allergies  FAMILY HISTORY:  family history includes Alzheimer's disease in his paternal grandmother; Colon cancer in his paternal uncle and paternal uncle; Colon polyps in his mother; Dementia in his paternal grandmother; Heart attack in his maternal grandfather; Heart disease in his maternal grandmother; Hypertension in his mother; Lung cancer in his father. SOCIAL HISTORY:  reports that he quit smoking about 11 years ago. His smoking use included cigarettes. He started smoking about 21 years ago. He has a 10 pack-year smoking history. He has been exposed to tobacco smoke. He quit smokeless tobacco use about 8 years ago.  His smokeless tobacco use included snuff. He reports current alcohol use. He reports that he does not use drugs.   Review of Systems:  Gen:  Denies  fever, sweats, chills weight loss  HEENT: Denies blurred vision, double vision, ear pain, eye pain, hearing loss, nose bleeds, sore throat Cardiac:  No dizziness, chest pain or heaviness, chest tightness,edema, No JVD Resp:   No cough, -sputum  production, -shortness of breath,-wheezing, -hemoptysis,  Gi: Denies swallowing difficulty, stomach pain, nausea or vomiting, diarrhea, constipation, bowel incontinence Gu:  Denies bladder incontinence, burning urine Ext:   Denies Joint pain, stiffness or swelling Skin: Denies  skin rash, easy bruising or bleeding or hives Endoc:  Denies polyuria, polydipsia , polyphagia or weight change Psych:   Denies depression, insomnia or hallucinations  Other:  All other systems  negative  VITAL SIGNS: BP 138/82 (BP Location: Right Arm, Cuff Size: Large)   Pulse 91   Temp (!) 97.1 F (36.2 C)   Ht 6\' 8"  (2.032 m)   Wt (!) 403 lb 12.8 oz (183.2 kg)   SpO2 96%   BMI 44.36 kg/m    Physical Examination:   General Appearance: No distress  EYES PERRLA, EOM intact.   NECK Supple, No JVD Pulmonary: normal breath sounds, No wheezing.  CardiovascularNormal S1,S2.  No m/r/g.   Abdomen: Benign, Soft, non-tender. Skin:   warm, no rashes, no ecchymosis  Extremities: normal, no cyanosis, clubbing. Neuro:without focal findings,  speech normal  PSYCHIATRIC: Mood, affect within normal limits.   ASSESSMENT AND PLAN  OSA Counseled patient on the importance of using CPAP therapy. For mask discomfort, will try patient on the Airfit F30i FFM. Discussed the consequences of untreated sleep apnea. Advised not to drive drowsy for safety of patient and others. Will follow up in 3 months.    HTN Stable, on current management. Following with PCP.   Morbid obesity Counseled patient on diet and lifestyle modification.    Patient  satisfied with Plan of action and management. All questions answered  I spent a total of 31 minutes reviewing chart data, face-to-face evaluation with the patient, counseling and coordination of care as detailed above.    Zulma Court, M.D.  Sleep Medicine [facility]

## 2023-11-23 NOTE — Patient Instructions (Addendum)

## 2023-12-05 DIAGNOSIS — G4733 Obstructive sleep apnea (adult) (pediatric): Secondary | ICD-10-CM | POA: Diagnosis not present

## 2024-01-05 DIAGNOSIS — G4733 Obstructive sleep apnea (adult) (pediatric): Secondary | ICD-10-CM | POA: Diagnosis not present

## 2024-01-08 ENCOUNTER — Ambulatory Visit: Admitting: Family Medicine

## 2024-01-08 NOTE — Progress Notes (Deleted)
      Established patient visit   Patient: Andrew Andersen.   DOB: [birth year]   48 y.o. Male  MRN: [id number] Visit Date: 01/08/2024  Today's healthcare provider: Mimi Alt, MD   No chief complaint on file.  Subjective       Discussed the use of AI scribe software for clinical note transcription with the patient, who gave verbal consent to proceed.  History of Present Illness      Past Medical History:  Diagnosis Date   Allergy    Depression    Hypertension    Seasonal allergies     Medications: Outpatient Medications Prior to Visit  Medication Sig   atorvastatin  (LIPITOR) 10 MG tablet Take 1 tablet (10 mg total) by mouth daily.   EPINEPHrine  0.3 mg/0.3 mL IJ SOAJ injection Inject 0.3 mg into the muscle as needed for anaphylaxis.   ibuprofen  (ADVIL ) 600 MG tablet Take 1 tablet (600 mg total) by mouth every 6 (six) hours as needed.   losartan -hydrochlorothiazide  (HYZAAR) 100-25 MG tablet Take 1 tablet by mouth daily.   oxyCODONE  (OXY IR/ROXICODONE ) 5 MG immediate release tablet Take 1 tablet (5 mg total) by mouth every 6 (six) hours as needed for severe pain or breakthrough pain. (Patient not taking: Reported on 11/23/2023)   sertraline  (ZOLOFT ) 100 MG tablet Take 150 mg by mouth daily.   traZODone  (DESYREL ) 100 MG tablet Take 100 mg by mouth at bedtime as needed for sleep.   No facility-administered medications prior to visit.    Review of Systems  {Insert previous labs (optional):23779} {See past labs  Heme  Chem  Endocrine  Serology  Results Review (optional):1}   Objective    There were no vitals taken for this visit. {Insert last BP/Wt (optional):23777}{See vitals history (optional):1}    Physical Exam  ***  No results found for any visits on 01/08/24.  Assessment & Plan     Problem List Items Addressed This Visit       Cardiovascular and Mediastinum   Primary hypertension - Primary    Assessment and Plan Assessment &  Plan      No follow-ups on file.         Mimi Alt, MD  [facility] [phone] (phone) [phone] (fax)  [facility]

## 2024-02-04 DIAGNOSIS — G4733 Obstructive sleep apnea (adult) (pediatric): Secondary | ICD-10-CM | POA: Diagnosis not present

## 2024-02-13 DIAGNOSIS — F320 Major depressive disorder, single episode, mild: Secondary | ICD-10-CM | POA: Diagnosis not present

## 2024-02-13 DIAGNOSIS — F5105 Insomnia due to other mental disorder: Secondary | ICD-10-CM | POA: Diagnosis not present

## 2024-02-13 DIAGNOSIS — F409 Phobic anxiety disorder, unspecified: Secondary | ICD-10-CM | POA: Diagnosis not present

## 2024-02-26 ENCOUNTER — Ambulatory Visit: Admitting: Sleep Medicine

## 2024-02-27 DIAGNOSIS — Z0000 Encounter for general adult medical examination without abnormal findings: Secondary | ICD-10-CM | POA: Diagnosis not present

## 2024-02-27 DIAGNOSIS — G4733 Obstructive sleep apnea (adult) (pediatric): Secondary | ICD-10-CM | POA: Diagnosis not present

## 2024-02-27 DIAGNOSIS — K3532 Acute appendicitis with perforation and localized peritonitis, without abscess: Secondary | ICD-10-CM | POA: Diagnosis not present

## 2024-02-27 DIAGNOSIS — Z131 Encounter for screening for diabetes mellitus: Secondary | ICD-10-CM | POA: Diagnosis not present

## 2024-02-27 DIAGNOSIS — Z125 Encounter for screening for malignant neoplasm of prostate: Secondary | ICD-10-CM | POA: Diagnosis not present

## 2024-02-27 DIAGNOSIS — Z1331 Encounter for screening for depression: Secondary | ICD-10-CM | POA: Diagnosis not present

## 2024-02-27 DIAGNOSIS — E782 Mixed hyperlipidemia: Secondary | ICD-10-CM | POA: Diagnosis not present

## 2024-02-27 DIAGNOSIS — Z1389 Encounter for screening for other disorder: Secondary | ICD-10-CM | POA: Diagnosis not present

## 2024-03-27 DIAGNOSIS — N411 Chronic prostatitis: Secondary | ICD-10-CM | POA: Diagnosis not present

## 2024-03-27 DIAGNOSIS — Z1331 Encounter for screening for depression: Secondary | ICD-10-CM | POA: Diagnosis not present

## 2024-03-27 DIAGNOSIS — Z0000 Encounter for general adult medical examination without abnormal findings: Secondary | ICD-10-CM | POA: Diagnosis not present

## 2024-03-27 DIAGNOSIS — R5382 Chronic fatigue, unspecified: Secondary | ICD-10-CM | POA: Diagnosis not present

## 2024-04-04 DIAGNOSIS — N411 Chronic prostatitis: Secondary | ICD-10-CM | POA: Diagnosis not present

## 2024-04-04 DIAGNOSIS — R5382 Chronic fatigue, unspecified: Secondary | ICD-10-CM | POA: Diagnosis not present

## 2024-05-13 ENCOUNTER — Ambulatory Visit: Admitting: Sleep Medicine

## 2024-06-04 DIAGNOSIS — F409 Phobic anxiety disorder, unspecified: Secondary | ICD-10-CM | POA: Diagnosis not present

## 2024-06-04 DIAGNOSIS — F5105 Insomnia due to other mental disorder: Secondary | ICD-10-CM | POA: Diagnosis not present

## 2024-06-04 DIAGNOSIS — F320 Major depressive disorder, single episode, mild: Secondary | ICD-10-CM | POA: Diagnosis not present

## 2024-06-25 DIAGNOSIS — D2271 Melanocytic nevi of right lower limb, including hip: Secondary | ICD-10-CM | POA: Diagnosis not present

## 2024-06-25 DIAGNOSIS — D2261 Melanocytic nevi of right upper limb, including shoulder: Secondary | ICD-10-CM | POA: Diagnosis not present

## 2024-06-25 DIAGNOSIS — D2272 Melanocytic nevi of left lower limb, including hip: Secondary | ICD-10-CM | POA: Diagnosis not present

## 2024-06-25 DIAGNOSIS — D2262 Melanocytic nevi of left upper limb, including shoulder: Secondary | ICD-10-CM | POA: Diagnosis not present

## 2024-07-28 ENCOUNTER — Other Ambulatory Visit: Payer: Self-pay | Admitting: Family Medicine

## 2024-07-28 DIAGNOSIS — I10 Essential (primary) hypertension: Secondary | ICD-10-CM

## 2024-07-29 NOTE — Telephone Encounter (Signed)
 Patient sees Dr.Gilbert at [facility] since 02/2024
